# Patient Record
Sex: Female | Born: 1945 | Race: Black or African American | Hispanic: No | Marital: Single | Smoking: Never smoker
Health system: Southern US, Community
[De-identification: ages and names within clinical notes are randomized; demographics above are authoritative.]

## PROBLEM LIST (undated history)

## (undated) DIAGNOSIS — R921 Mammographic calcification found on diagnostic imaging of breast: Secondary | ICD-10-CM

## (undated) DIAGNOSIS — R87619 Unspecified abnormal cytological findings in specimens from cervix uteri: Secondary | ICD-10-CM

## (undated) DIAGNOSIS — R102 Pelvic and perineal pain: Secondary | ICD-10-CM

## (undated) DIAGNOSIS — N19 Unspecified kidney failure: Secondary | ICD-10-CM

## (undated) DIAGNOSIS — C801 Malignant (primary) neoplasm, unspecified: Secondary | ICD-10-CM

## (undated) DIAGNOSIS — IMO0002 Reserved for concepts with insufficient information to code with codable children: Secondary | ICD-10-CM

## (undated) DIAGNOSIS — K5732 Diverticulitis of large intestine without perforation or abscess without bleeding: Secondary | ICD-10-CM

## (undated) DIAGNOSIS — C880 Waldenstrom macroglobulinemia: Principal | ICD-10-CM

## (undated) HISTORY — PX: TONSILLECTOMY: SUR1361

## (undated) HISTORY — DX: Unspecified kidney failure: N19

## (undated) HISTORY — PX: TOTAL ABDOMINAL HYSTERECTOMY: SHX209

## (undated) HISTORY — DX: Diverticulitis of large intestine without perforation or abscess without bleeding: K57.32

## (undated) HISTORY — DX: Pelvic and perineal pain: R10.2

## (undated) HISTORY — DX: Mammographic calcification found on diagnostic imaging of breast: R92.1

## (undated) HISTORY — DX: Waldenstrom macroglobulinemia: C88.0

## (undated) HISTORY — PX: WISDOM TOOTH EXTRACTION: SHX21

## (undated) HISTORY — DX: Unspecified abnormal cytological findings in specimens from cervix uteri: R87.619

## (undated) HISTORY — DX: Reserved for concepts with insufficient information to code with codable children: IMO0002

---

## 2007-05-01 ENCOUNTER — Ambulatory Visit: Payer: Self-pay | Admitting: Hematology & Oncology

## 2007-05-18 LAB — CBC WITH DIFFERENTIAL/PLATELET
Basophils Absolute: 0 10*3/uL (ref 0.0–0.1)
Eosinophils Absolute: 0.1 10*3/uL (ref 0.0–0.5)
HGB: 12 g/dL (ref 11.6–15.9)
LYMPH%: 34.5 % (ref 14.0–48.0)
MCH: 29 pg (ref 26.0–34.0)
MCV: 82.4 fL (ref 81.0–101.0)
MONO%: 7.3 % (ref 0.0–13.0)
NEUT#: 3.6 10*3/uL (ref 1.5–6.5)
Platelets: 317 10*3/uL (ref 145–400)

## 2007-05-22 LAB — COMPREHENSIVE METABOLIC PANEL WITH GFR
ALT: 12 U/L (ref 0–35)
AST: 12 U/L (ref 0–37)
Albumin: 4 g/dL (ref 3.5–5.2)
Alkaline Phosphatase: 77 U/L (ref 39–117)
BUN: 13 mg/dL (ref 6–23)
CO2: 23 meq/L (ref 19–32)
Calcium: 10.3 mg/dL (ref 8.4–10.5)
Chloride: 103 meq/L (ref 96–112)
Creatinine, Ser: 0.96 mg/dL (ref 0.40–1.20)
Glucose, Bld: 112 mg/dL — ABNORMAL HIGH (ref 70–99)
Potassium: 4.2 meq/L (ref 3.5–5.3)
Sodium: 138 meq/L (ref 135–145)
Total Bilirubin: 0.4 mg/dL (ref 0.3–1.2)
Total Protein: 9 g/dL — ABNORMAL HIGH (ref 6.0–8.3)

## 2007-05-22 LAB — IGG, IGA, IGM
IgA: 109 mg/dL (ref 68–378)
IgG (Immunoglobin G), Serum: 1560 mg/dL (ref 694–1618)
IgM, Serum: 2490 mg/dL — ABNORMAL HIGH (ref 60–263)

## 2007-05-22 LAB — PROTEIN ELECTROPHORESIS, SERUM
Albumin ELP: 43.8 % — ABNORMAL LOW (ref 55.8–66.1)
Beta 2: 6.3 % (ref 3.2–6.5)
Beta Globulin: 5.2 % (ref 4.7–7.2)

## 2007-08-15 ENCOUNTER — Ambulatory Visit: Payer: Self-pay | Admitting: Hematology & Oncology

## 2007-08-17 LAB — CBC WITH DIFFERENTIAL/PLATELET
Basophils Absolute: 0.1 10*3/uL (ref 0.0–0.1)
Eosinophils Absolute: 0.1 10*3/uL (ref 0.0–0.5)
HGB: 11.9 g/dL (ref 11.6–15.9)
LYMPH%: 36.8 % (ref 14.0–48.0)
MCV: 82.3 fL (ref 81.0–101.0)
MONO%: 5.9 % (ref 0.0–13.0)
NEUT#: 4.1 10*3/uL (ref 1.5–6.5)
Platelets: 347 10*3/uL (ref 145–400)
RBC: 4.06 10*6/uL (ref 3.70–5.32)

## 2007-08-21 LAB — PROTEIN ELECTROPHORESIS, SERUM
Alpha-1-Globulin: 4.1 % (ref 2.9–4.9)
Alpha-2-Globulin: 8.1 % (ref 7.1–11.8)
M-Spike, %: 1.64 g/dL

## 2007-08-21 LAB — COMPREHENSIVE METABOLIC PANEL
Alkaline Phosphatase: 74 U/L (ref 39–117)
BUN: 17 mg/dL (ref 6–23)
Creatinine, Ser: 0.97 mg/dL (ref 0.40–1.20)
Glucose, Bld: 93 mg/dL (ref 70–99)
Total Bilirubin: 0.4 mg/dL (ref 0.3–1.2)

## 2007-08-21 LAB — IGG, IGA, IGM
IgA: 106 mg/dL (ref 68–378)
IgM, Serum: 2410 mg/dL — ABNORMAL HIGH (ref 60–263)

## 2007-09-13 DIAGNOSIS — R102 Pelvic and perineal pain: Secondary | ICD-10-CM

## 2007-09-13 HISTORY — DX: Pelvic and perineal pain: R10.2

## 2007-11-16 ENCOUNTER — Ambulatory Visit (HOSPITAL_COMMUNITY): Admission: RE | Admit: 2007-11-16 | Discharge: 2007-11-16 | Payer: Self-pay | Admitting: Gastroenterology

## 2007-11-21 ENCOUNTER — Ambulatory Visit: Payer: Self-pay | Admitting: Hematology & Oncology

## 2007-11-23 LAB — CBC & DIFF AND RETIC
Basophils Absolute: 0 10*3/uL (ref 0.0–0.1)
Eosinophils Absolute: 0.1 10*3/uL (ref 0.0–0.5)
HCT: 33.4 % — ABNORMAL LOW (ref 34.8–46.6)
HGB: 11.9 g/dL (ref 11.6–15.9)
IRF: 0.42 — ABNORMAL HIGH (ref 0.130–0.330)
LYMPH%: 34.8 % (ref 14.0–48.0)
MONO#: 0.6 10*3/uL (ref 0.1–0.9)
NEUT#: 4.5 10*3/uL (ref 1.5–6.5)
NEUT%: 55.4 % (ref 39.6–76.8)
Platelets: 376 10*3/uL (ref 145–400)
WBC: 8.1 10*3/uL (ref 3.9–10.0)

## 2007-11-27 LAB — PROTEIN ELECTROPHORESIS, SERUM
Alpha-2-Globulin: 8.9 % (ref 7.1–11.8)
Beta Globulin: 5.2 % (ref 4.7–7.2)
Gamma Globulin: 31.9 % — ABNORMAL HIGH (ref 11.1–18.8)
M-Spike, %: 1.47 g/dL

## 2007-11-27 LAB — COMPREHENSIVE METABOLIC PANEL
ALT: 9 U/L (ref 0–35)
Albumin: 3.5 g/dL (ref 3.5–5.2)
CO2: 23 mEq/L (ref 19–32)
Glucose, Bld: 105 mg/dL — ABNORMAL HIGH (ref 70–99)
Potassium: 4.2 mEq/L (ref 3.5–5.3)
Sodium: 139 mEq/L (ref 135–145)
Total Protein: 8.1 g/dL (ref 6.0–8.3)

## 2007-11-27 LAB — IGG, IGA, IGM: IgG (Immunoglobin G), Serum: 1430 mg/dL (ref 694–1618)

## 2007-11-28 ENCOUNTER — Encounter: Admission: RE | Admit: 2007-11-28 | Discharge: 2007-11-28 | Payer: Self-pay | Admitting: Internal Medicine

## 2008-02-19 ENCOUNTER — Ambulatory Visit: Payer: Self-pay | Admitting: Hematology & Oncology

## 2008-02-22 LAB — CBC WITH DIFFERENTIAL/PLATELET
Basophils Absolute: 0 10*3/uL (ref 0.0–0.1)
EOS%: 1.3 % (ref 0.0–7.0)
Eosinophils Absolute: 0.1 10*3/uL (ref 0.0–0.5)
HGB: 11.6 g/dL (ref 11.6–15.9)
NEUT#: 4.3 10*3/uL (ref 1.5–6.5)
RDW: 15.3 % — ABNORMAL HIGH (ref 11.3–14.5)
lymph#: 3.1 10*3/uL (ref 0.9–3.3)

## 2008-02-22 LAB — CHCC SMEAR

## 2008-02-26 LAB — COMPREHENSIVE METABOLIC PANEL
ALT: 10 U/L (ref 0–35)
Albumin: 3.7 g/dL (ref 3.5–5.2)
Alkaline Phosphatase: 71 U/L (ref 39–117)
CO2: 22 mEq/L (ref 19–32)
Glucose, Bld: 94 mg/dL (ref 70–99)
Potassium: 4.2 mEq/L (ref 3.5–5.3)
Sodium: 138 mEq/L (ref 135–145)
Total Protein: 8.4 g/dL — ABNORMAL HIGH (ref 6.0–8.3)

## 2008-02-26 LAB — PROTEIN ELECTROPHORESIS, SERUM
Albumin ELP: 42.7 % — ABNORMAL LOW (ref 55.8–66.1)
Alpha-2-Globulin: 9.2 % (ref 7.1–11.8)
Beta 2: 6.2 % (ref 3.2–6.5)
Beta Globulin: 5.1 % (ref 4.7–7.2)

## 2008-02-26 LAB — IGG, IGA, IGM: IgG (Immunoglobin G), Serum: 1650 mg/dL — ABNORMAL HIGH (ref 694–1618)

## 2008-05-22 ENCOUNTER — Ambulatory Visit: Payer: Self-pay | Admitting: Hematology & Oncology

## 2008-05-23 LAB — CBC WITH DIFFERENTIAL (CANCER CENTER ONLY)
BASO#: 0.1 10*3/uL (ref 0.0–0.2)
EOS%: 2.5 % (ref 0.0–7.0)
HCT: 29.8 % — ABNORMAL LOW (ref 34.8–46.6)
MCV: 80 fL — ABNORMAL LOW (ref 81–101)
MONO%: 5.9 % (ref 0.0–13.0)
NEUT#: 4.5 10*3/uL (ref 1.5–6.5)
NEUT%: 48.4 % (ref 39.6–80.0)
RBC: 3.72 10*6/uL (ref 3.70–5.32)
RDW: 14.9 % — ABNORMAL HIGH (ref 10.5–14.6)
WBC: 9.4 10*3/uL (ref 3.9–10.0)

## 2008-05-23 LAB — CHCC SATELLITE - SMEAR

## 2008-05-26 LAB — TRANSFERRIN RECEPTOR, SOLUABLE: Transferrin Receptor, Soluble: 22.7 nmol/L

## 2008-05-26 LAB — FERRITIN: Ferritin: 37 ng/mL (ref 10–291)

## 2008-05-28 LAB — PROTEIN ELECTROPHORESIS, SERUM
Alpha-2-Globulin: 9.9 % (ref 7.1–11.8)
Beta 2: 5.5 % (ref 3.2–6.5)
Beta Globulin: 5.3 % (ref 4.7–7.2)
M-Spike, %: 1.64 g/dL
Total Protein, Serum Electrophoresis: 8.3 g/dL (ref 6.0–8.3)

## 2008-05-28 LAB — COMPREHENSIVE METABOLIC PANEL
AST: 15 U/L (ref 0–37)
Albumin: 3.7 g/dL (ref 3.5–5.2)
Alkaline Phosphatase: 72 U/L (ref 39–117)
Glucose, Bld: 88 mg/dL (ref 70–99)
Potassium: 4.2 mEq/L (ref 3.5–5.3)
Sodium: 141 mEq/L (ref 135–145)
Total Bilirubin: 0.5 mg/dL (ref 0.3–1.2)
Total Protein: 8.3 g/dL (ref 6.0–8.3)

## 2008-05-28 LAB — IGG, IGA, IGM: IgG (Immunoglobin G), Serum: 1360 mg/dL (ref 694–1618)

## 2008-05-28 LAB — LACTATE DEHYDROGENASE: LDH: 124 U/L (ref 94–250)

## 2008-06-19 LAB — RETICULOCYTES (CHCC)
ABS Retic: 69.7 10*3/uL (ref 19.0–186.0)
RBC.: 4.1 MIL/uL (ref 3.87–5.11)
Retic Ct Pct: 1.7 % (ref 0.4–3.1)

## 2008-06-19 LAB — CBC WITH DIFFERENTIAL (CANCER CENTER ONLY)
BASO%: 0.7 % (ref 0.0–2.0)
Eosinophils Absolute: 0.2 10*3/uL (ref 0.0–0.5)
MONO#: 0.7 10*3/uL (ref 0.1–0.9)
MONO%: 7.8 % (ref 0.0–13.0)
NEUT#: 4 10*3/uL (ref 1.5–6.5)
Platelets: 400 10*3/uL (ref 145–400)
RBC: 4.16 10*6/uL (ref 3.70–5.32)
RDW: 14.9 % — ABNORMAL HIGH (ref 10.5–14.6)
WBC: 8.6 10*3/uL (ref 3.9–10.0)

## 2008-08-20 ENCOUNTER — Ambulatory Visit: Payer: Self-pay | Admitting: Hematology & Oncology

## 2008-08-21 LAB — CBC WITH DIFFERENTIAL (CANCER CENTER ONLY)
BASO%: 0.6 % (ref 0.0–2.0)
EOS%: 2.1 % (ref 0.0–7.0)
LYMPH#: 3.8 10*3/uL — ABNORMAL HIGH (ref 0.9–3.3)
MCHC: 33.5 g/dL (ref 32.0–36.0)
NEUT#: 3.9 10*3/uL (ref 1.5–6.5)
NEUT%: 45.8 % (ref 39.6–80.0)
RDW: 14.8 % — ABNORMAL HIGH (ref 10.5–14.6)

## 2008-08-25 LAB — SPEP & IFE WITH QIG
Albumin ELP: 40.8 % — ABNORMAL LOW (ref 55.8–66.1)
Alpha-1-Globulin: 4.3 % (ref 2.9–4.9)
Alpha-2-Globulin: 9.7 % (ref 7.1–11.8)
Beta 2: 6.6 % — ABNORMAL HIGH (ref 3.2–6.5)
Beta Globulin: 5.1 % (ref 4.7–7.2)
IgA: 117 mg/dL (ref 68–378)
Total Protein, Serum Electrophoresis: 7.9 g/dL (ref 6.0–8.3)

## 2008-08-25 LAB — RETICULOCYTES (CHCC)
RBC.: 4.12 MIL/uL (ref 3.87–5.11)
Retic Ct Pct: 1.7 % (ref 0.4–3.1)

## 2008-09-12 DIAGNOSIS — R921 Mammographic calcification found on diagnostic imaging of breast: Secondary | ICD-10-CM

## 2008-09-12 HISTORY — DX: Mammographic calcification found on diagnostic imaging of breast: R92.1

## 2008-11-26 ENCOUNTER — Ambulatory Visit: Payer: Self-pay | Admitting: Hematology & Oncology

## 2008-11-27 LAB — CBC WITH DIFFERENTIAL (CANCER CENTER ONLY)
BASO%: 0.6 % (ref 0.0–2.0)
EOS%: 2.1 % (ref 0.0–7.0)
HCT: 34.4 % — ABNORMAL LOW (ref 34.8–46.6)
LYMPH%: 39.4 % (ref 14.0–48.0)
MCHC: 32.8 g/dL (ref 32.0–36.0)
MCV: 84 fL (ref 81–101)
NEUT%: 50.7 % (ref 39.6–80.0)
RDW: 14.8 % — ABNORMAL HIGH (ref 10.5–14.6)

## 2008-11-27 LAB — CHCC SATELLITE - SMEAR

## 2008-11-28 ENCOUNTER — Encounter: Admission: RE | Admit: 2008-11-28 | Discharge: 2008-11-28 | Payer: Self-pay | Admitting: Internal Medicine

## 2008-12-01 LAB — COMPREHENSIVE METABOLIC PANEL
BUN: 18 mg/dL (ref 6–23)
CO2: 24 mEq/L (ref 19–32)
Creatinine, Ser: 0.98 mg/dL (ref 0.40–1.20)
Glucose, Bld: 94 mg/dL (ref 70–99)
Total Bilirubin: 0.3 mg/dL (ref 0.3–1.2)
Total Protein: 7.4 g/dL (ref 6.0–8.3)

## 2008-12-01 LAB — SPEP & IFE WITH QIG
Alpha-2-Globulin: 10.1 % (ref 7.1–11.8)
Beta 2: 6.9 % — ABNORMAL HIGH (ref 3.2–6.5)
Beta Globulin: 4.9 % (ref 4.7–7.2)
IgG (Immunoglobin G), Serum: 1120 mg/dL (ref 694–1618)
M-Spike, %: 1.7 g/dL
Total Protein, Serum Electrophoresis: 7.4 g/dL (ref 6.0–8.3)

## 2008-12-01 LAB — KAPPA/LAMBDA LIGHT CHAINS
Kappa free light chain: 17.5 mg/dL — ABNORMAL HIGH (ref 0.33–1.94)
Kappa:Lambda Ratio: 0.15 — ABNORMAL LOW (ref 0.26–1.65)
Lambda Free Lght Chn: 114 mg/dL — ABNORMAL HIGH (ref 0.57–2.63)

## 2008-12-03 ENCOUNTER — Encounter: Admission: RE | Admit: 2008-12-03 | Discharge: 2008-12-03 | Payer: Self-pay | Admitting: Internal Medicine

## 2009-02-11 ENCOUNTER — Ambulatory Visit: Payer: Self-pay | Admitting: Hematology & Oncology

## 2009-02-12 LAB — CBC WITH DIFFERENTIAL (CANCER CENTER ONLY)
Eosinophils Absolute: 0.3 10*3/uL (ref 0.0–0.5)
HCT: 33 % — ABNORMAL LOW (ref 34.8–46.6)
LYMPH%: 36.2 % (ref 14.0–48.0)
MCV: 81 fL (ref 81–101)
MONO#: 0.8 10*3/uL (ref 0.1–0.9)
NEUT%: 52.6 % (ref 39.6–80.0)
RBC: 4.06 10*6/uL (ref 3.70–5.32)
RDW: 15.4 % — ABNORMAL HIGH (ref 10.5–14.6)
WBC: 10 10*3/uL (ref 3.9–10.0)

## 2009-02-16 LAB — KAPPA/LAMBDA LIGHT CHAINS
Kappa free light chain: 12.3 mg/dL — ABNORMAL HIGH (ref 0.33–1.94)
Lambda Free Lght Chn: 108 mg/dL — ABNORMAL HIGH (ref 0.57–2.63)

## 2009-02-16 LAB — SPEP & IFE WITH QIG
Albumin ELP: 38.8 % — ABNORMAL LOW (ref 55.8–66.1)
IgA: 102 mg/dL (ref 68–378)
IgG (Immunoglobin G), Serum: 1150 mg/dL (ref 694–1618)
M-Spike, %: 1.58 g/dL
Total Protein, Serum Electrophoresis: 7.4 g/dL (ref 6.0–8.3)

## 2009-02-16 LAB — COMPREHENSIVE METABOLIC PANEL
Albumin: 2.9 g/dL — ABNORMAL LOW (ref 3.5–5.2)
BUN: 13 mg/dL (ref 6–23)
CO2: 23 mEq/L (ref 19–32)
Calcium: 9.7 mg/dL (ref 8.4–10.5)
Chloride: 106 mEq/L (ref 96–112)
Glucose, Bld: 89 mg/dL (ref 70–99)
Potassium: 4.2 mEq/L (ref 3.5–5.3)
Sodium: 140 mEq/L (ref 135–145)
Total Protein: 7.4 g/dL (ref 6.0–8.3)

## 2009-02-16 LAB — FERRITIN: Ferritin: 56 ng/mL (ref 10–291)

## 2009-05-19 ENCOUNTER — Ambulatory Visit: Payer: Self-pay | Admitting: Hematology & Oncology

## 2009-05-20 LAB — CBC WITH DIFFERENTIAL (CANCER CENTER ONLY)
BASO#: 0.1 10*3/uL (ref 0.0–0.2)
Eosinophils Absolute: 0.2 10*3/uL (ref 0.0–0.5)
HCT: 32.1 % — ABNORMAL LOW (ref 34.8–46.6)
HGB: 11 g/dL — ABNORMAL LOW (ref 11.6–15.9)
LYMPH%: 34 % (ref 14.0–48.0)
MCV: 81 fL (ref 81–101)
MONO#: 0.5 10*3/uL (ref 0.1–0.9)
Platelets: 434 10*3/uL — ABNORMAL HIGH (ref 145–400)
RBC: 3.96 10*6/uL (ref 3.70–5.32)
WBC: 9.9 10*3/uL (ref 3.9–10.0)

## 2009-05-20 LAB — CHCC SATELLITE - SMEAR

## 2009-05-21 LAB — LIPID PANEL
Cholesterol: 193 mg/dL (ref 0–200)
HDL: 40 mg/dL (ref >39)
Total CHOL/HDL Ratio: 4.8 Ratio
Triglycerides: 153 mg/dL — ABNORMAL HIGH (ref <150)

## 2009-05-22 LAB — COMPREHENSIVE METABOLIC PANEL
AST: 13 U/L (ref 0–37)
Albumin: 2.7 g/dL — ABNORMAL LOW (ref 3.5–5.2)
BUN: 18 mg/dL (ref 6–23)
Calcium: 9.9 mg/dL (ref 8.4–10.5)
Chloride: 109 mEq/L (ref 96–112)
Glucose, Bld: 99 mg/dL (ref 70–99)
Potassium: 4.2 mEq/L (ref 3.5–5.3)
Sodium: 140 mEq/L (ref 135–145)
Total Protein: 7.2 g/dL (ref 6.0–8.3)

## 2009-05-22 LAB — SPEP & IFE WITH QIG
Albumin ELP: 37.7 % — ABNORMAL LOW (ref 55.8–66.1)
Alpha-1-Globulin: 4.7 % (ref 2.9–4.9)
Beta 2: 7.2 % — ABNORMAL HIGH (ref 3.2–6.5)
IgM, Serum: 2340 mg/dL — ABNORMAL HIGH (ref 60–263)
Total Protein, Serum Electrophoresis: 7.2 g/dL (ref 6.0–8.3)

## 2009-05-22 LAB — KAPPA/LAMBDA LIGHT CHAINS: Lambda Free Lght Chn: 128 mg/dL — ABNORMAL HIGH (ref 0.57–2.63)

## 2009-05-22 LAB — RETICULOCYTES (CHCC)
ABS Retic: 69.5 10*3/uL (ref 19.0–186.0)
Retic Ct Pct: 1.8 % (ref 0.4–3.1)

## 2009-08-18 ENCOUNTER — Ambulatory Visit: Payer: Self-pay | Admitting: Hematology & Oncology

## 2009-08-19 LAB — CBC WITH DIFFERENTIAL (CANCER CENTER ONLY)
Eosinophils Absolute: 0.3 10*3/uL (ref 0.0–0.5)
LYMPH#: 4.7 10*3/uL — ABNORMAL HIGH (ref 0.9–3.3)
MONO#: 0.8 10*3/uL (ref 0.1–0.9)
MONO%: 7.4 % (ref 0.0–13.0)
NEUT#: 5.3 10*3/uL (ref 1.5–6.5)
Platelets: 436 10*3/uL — ABNORMAL HIGH (ref 145–400)
RBC: 4.09 10*6/uL (ref 3.70–5.32)
WBC: 11.2 10*3/uL — ABNORMAL HIGH (ref 3.9–10.0)

## 2009-08-22 LAB — COMPREHENSIVE METABOLIC PANEL
ALT: 10 U/L (ref 0–35)
AST: 14 U/L (ref 0–37)
Albumin: 2.8 g/dL — ABNORMAL LOW (ref 3.5–5.2)
Alkaline Phosphatase: 68 U/L (ref 39–117)
Potassium: 4.1 mEq/L (ref 3.5–5.3)
Sodium: 141 mEq/L (ref 135–145)
Total Protein: 7.5 g/dL (ref 6.0–8.3)

## 2009-08-22 LAB — SPEP & IFE WITH QIG
Albumin ELP: 34.5 % — ABNORMAL LOW (ref 55.8–66.1)
Alpha-2-Globulin: 11.7 % (ref 7.1–11.8)
Beta Globulin: 5.4 % (ref 4.7–7.2)
Total Protein, Serum Electrophoresis: 7.5 g/dL (ref 6.0–8.3)

## 2009-11-10 ENCOUNTER — Ambulatory Visit: Payer: Self-pay | Admitting: Hematology & Oncology

## 2009-11-11 LAB — CBC WITH DIFFERENTIAL (CANCER CENTER ONLY)
BASO#: 0.1 10*3/uL (ref 0.0–0.2)
Eosinophils Absolute: 0.2 10*3/uL (ref 0.0–0.5)
HCT: 33.4 % — ABNORMAL LOW (ref 34.8–46.6)
HGB: 11.1 g/dL — ABNORMAL LOW (ref 11.6–15.9)
LYMPH%: 41.2 % (ref 14.0–48.0)
MCH: 26.9 pg (ref 26.0–34.0)
MCV: 81 fL (ref 81–101)
MONO#: 0.8 10*3/uL (ref 0.1–0.9)
MONO%: 6.8 % (ref 0.0–13.0)
Platelets: 561 10*3/uL — ABNORMAL HIGH (ref 145–400)
RBC: 4.1 10*6/uL (ref 3.70–5.32)
WBC: 11.5 10*3/uL — ABNORMAL HIGH (ref 3.9–10.0)

## 2009-11-11 LAB — TECHNOLOGIST REVIEW CHCC SATELLITE: Tech Review: 2

## 2009-11-15 LAB — COMPREHENSIVE METABOLIC PANEL
ALT: 12 U/L (ref 0–35)
CO2: 25 mEq/L (ref 19–32)
Calcium: 9.4 mg/dL (ref 8.4–10.5)
Chloride: 105 mEq/L (ref 96–112)
Creatinine, Ser: 1.38 mg/dL — ABNORMAL HIGH (ref 0.40–1.20)
Glucose, Bld: 79 mg/dL (ref 70–99)
Sodium: 140 mEq/L (ref 135–145)
Total Protein: 7.4 g/dL (ref 6.0–8.3)

## 2009-11-15 LAB — SPEP & IFE WITH QIG
Alpha-1-Globulin: 5.2 % — ABNORMAL HIGH (ref 2.9–4.9)
Alpha-2-Globulin: 12.9 % — ABNORMAL HIGH (ref 7.1–11.8)
Beta 2: 8.2 % — ABNORMAL HIGH (ref 3.2–6.5)
Gamma Globulin: 35.1 % — ABNORMAL HIGH (ref 11.1–18.8)
IgG (Immunoglobin G), Serum: 1100 mg/dL (ref 694–1618)
M-Spike, %: 1.84 g/dL

## 2009-11-15 LAB — VISCOSITY, SERUM: Viscosity, Serum: 2.5 rel to H2O — ABNORMAL HIGH (ref 1.5–1.9)

## 2009-12-01 ENCOUNTER — Ambulatory Visit (HOSPITAL_COMMUNITY): Admission: RE | Admit: 2009-12-01 | Discharge: 2009-12-01 | Payer: Self-pay | Admitting: Hematology & Oncology

## 2009-12-04 ENCOUNTER — Encounter: Admission: RE | Admit: 2009-12-04 | Discharge: 2009-12-04 | Payer: Self-pay | Admitting: Obstetrics and Gynecology

## 2009-12-10 ENCOUNTER — Ambulatory Visit: Payer: Self-pay | Admitting: Hematology & Oncology

## 2010-01-12 ENCOUNTER — Ambulatory Visit: Payer: Self-pay | Admitting: Hematology & Oncology

## 2010-01-13 LAB — COMPREHENSIVE METABOLIC PANEL
AST: 19 U/L (ref 0–37)
Albumin: 2.6 g/dL — ABNORMAL LOW (ref 3.5–5.2)
Alkaline Phosphatase: 64 U/L (ref 39–117)
BUN: 13 mg/dL (ref 6–23)
Potassium: 4.3 mEq/L (ref 3.5–5.3)
Sodium: 142 mEq/L (ref 135–145)
Total Bilirubin: 0.3 mg/dL (ref 0.3–1.2)
Total Protein: 6.6 g/dL (ref 6.0–8.3)

## 2010-01-13 LAB — CBC WITH DIFFERENTIAL (CANCER CENTER ONLY)
EOS%: 4.5 % (ref 0.0–7.0)
LYMPH%: 41.4 % (ref 14.0–48.0)
MCH: 27.9 pg (ref 26.0–34.0)
MCHC: 33.2 g/dL (ref 32.0–36.0)
MCV: 84 fL (ref 81–101)
MONO%: 9.1 % (ref 0.0–13.0)
NEUT#: 3 10*3/uL (ref 1.5–6.5)
Platelets: 560 10*3/uL — ABNORMAL HIGH (ref 145–400)

## 2010-01-14 LAB — HEPATITIS PANEL, ACUTE
HCV Ab: NEGATIVE
Hep B C IgM: NEGATIVE

## 2010-02-01 LAB — CBC WITH DIFFERENTIAL (CANCER CENTER ONLY)
BASO%: 1 % (ref 0.0–2.0)
Eosinophils Absolute: 0.4 10*3/uL (ref 0.0–0.5)
LYMPH#: 2.9 10*3/uL (ref 0.9–3.3)
LYMPH%: 40 % (ref 14.0–48.0)
MCV: 85 fL (ref 81–101)
MONO#: 0.6 10*3/uL (ref 0.1–0.9)
Platelets: 529 10*3/uL — ABNORMAL HIGH (ref 145–400)
RBC: 4.23 10*6/uL (ref 3.70–5.32)
RDW: 16.2 % — ABNORMAL HIGH (ref 10.5–14.6)
WBC: 7.2 10*3/uL (ref 3.9–10.0)

## 2010-02-26 ENCOUNTER — Ambulatory Visit: Payer: Self-pay | Admitting: Hematology & Oncology

## 2010-03-01 LAB — CBC WITH DIFFERENTIAL (CANCER CENTER ONLY)
BASO%: 0.8 % (ref 0.0–2.0)
EOS%: 4 % (ref 0.0–7.0)
HCT: 35.8 % (ref 34.8–46.6)
HGB: 12.1 g/dL (ref 11.6–15.9)
LYMPH%: 36.3 % (ref 14.0–48.0)
MCV: 85 fL (ref 81–101)
NEUT%: 50.9 % (ref 39.6–80.0)
RBC: 4.2 10*6/uL (ref 3.70–5.32)

## 2010-03-08 LAB — JAK2 GENOTYPR: JAK2 GenotypR: NOT DETECTED

## 2010-03-08 LAB — COMPREHENSIVE METABOLIC PANEL
ALT: 10 U/L (ref 0–35)
AST: 16 U/L (ref 0–37)
Alkaline Phosphatase: 62 U/L (ref 39–117)
Calcium: 10.4 mg/dL (ref 8.4–10.5)
Chloride: 107 mEq/L (ref 96–112)
Creatinine, Ser: 1.45 mg/dL — ABNORMAL HIGH (ref 0.40–1.20)

## 2010-03-08 LAB — SPEP & IFE WITH QIG
Beta 2: 7.1 % — ABNORMAL HIGH (ref 3.2–6.5)
Beta Globulin: 5.2 % (ref 4.7–7.2)
Gamma Globulin: 27.7 % — ABNORMAL HIGH (ref 11.1–18.8)
IgA: 64 mg/dL — ABNORMAL LOW (ref 68–378)
IgG (Immunoglobin G), Serum: 584 mg/dL — ABNORMAL LOW (ref 694–1618)
M-Spike, %: 1.28 g/dL
Total Protein, Serum Electrophoresis: 6.2 g/dL (ref 6.0–8.3)

## 2010-03-08 LAB — KAPPA/LAMBDA LIGHT CHAINS: Kappa:Lambda Ratio: 0.17 — ABNORMAL LOW (ref 0.26–1.65)

## 2010-03-08 LAB — FERRITIN: Ferritin: 139 ng/mL (ref 10–291)

## 2010-03-22 LAB — CBC WITH DIFFERENTIAL (CANCER CENTER ONLY)
BASO%: 0.9 % (ref 0.0–2.0)
Eosinophils Absolute: 0.3 10*3/uL (ref 0.0–0.5)
HGB: 11.4 g/dL — ABNORMAL LOW (ref 11.6–15.9)
LYMPH%: 40.2 % (ref 14.0–48.0)
MCH: 28.7 pg (ref 26.0–34.0)
MONO%: 8.6 % (ref 0.0–13.0)
NEUT%: 45.5 % (ref 39.6–80.0)
RDW: 15 % — ABNORMAL HIGH (ref 10.5–14.6)

## 2010-04-13 ENCOUNTER — Ambulatory Visit: Payer: Self-pay | Admitting: Hematology & Oncology

## 2010-04-14 LAB — CBC WITH DIFFERENTIAL (CANCER CENTER ONLY)
BASO#: 0 10*3/uL (ref 0.0–0.2)
BASO%: 0.6 % (ref 0.0–2.0)
EOS%: 3.9 % (ref 0.0–7.0)
Eosinophils Absolute: 0.3 10*3/uL (ref 0.0–0.5)
HGB: 11.2 g/dL — ABNORMAL LOW (ref 11.6–15.9)
LYMPH%: 30.8 % (ref 14.0–48.0)
MCHC: 34.1 g/dL (ref 32.0–36.0)
MONO%: 6.8 % (ref 0.0–13.0)
NEUT%: 57.9 % (ref 39.6–80.0)
Platelets: 437 10*3/uL — ABNORMAL HIGH (ref 145–400)
RBC: 3.82 10*6/uL (ref 3.70–5.32)
RDW: 15.5 % — ABNORMAL HIGH (ref 10.5–14.6)

## 2010-04-16 LAB — PROTEIN ELECTROPHORESIS, SERUM
Albumin ELP: 39.1 % — ABNORMAL LOW (ref 55.8–66.1)
Alpha-2-Globulin: 15.5 % — ABNORMAL HIGH (ref 7.1–11.8)
Beta 2: 6 % (ref 3.2–6.5)
Beta Globulin: 5.1 % (ref 4.7–7.2)
Gamma Globulin: 24.2 % — ABNORMAL HIGH (ref 11.1–18.8)
M-Spike, %: 1.03 g/dL

## 2010-04-16 LAB — COMPREHENSIVE METABOLIC PANEL
Albumin: 2.7 g/dL — ABNORMAL LOW (ref 3.5–5.2)
BUN: 16 mg/dL (ref 6–23)
Glucose, Bld: 88 mg/dL (ref 70–99)
Sodium: 141 mEq/L (ref 135–145)

## 2010-04-16 LAB — FERRITIN: Ferritin: 148 ng/mL (ref 10–291)

## 2010-04-16 LAB — KAPPA/LAMBDA LIGHT CHAINS: Kappa free light chain: 14.2 mg/dL — ABNORMAL HIGH (ref 0.33–1.94)

## 2010-04-16 LAB — BETA 2 MICROGLOBULIN, SERUM: Beta-2 Microglobulin: 6.16 mg/L — ABNORMAL HIGH (ref 1.01–1.73)

## 2010-05-20 ENCOUNTER — Ambulatory Visit (HOSPITAL_COMMUNITY): Admission: RE | Admit: 2010-05-20 | Discharge: 2010-05-20 | Payer: Self-pay | Admitting: Hematology & Oncology

## 2010-06-01 ENCOUNTER — Ambulatory Visit: Payer: Self-pay | Admitting: Hematology & Oncology

## 2010-06-02 LAB — CBC WITH DIFFERENTIAL (CANCER CENTER ONLY)
EOS%: 2.9 % (ref 0.0–7.0)
Eosinophils Absolute: 0.2 10*3/uL (ref 0.0–0.5)
HCT: 36.1 % (ref 34.8–46.6)
HGB: 11.9 g/dL (ref 11.6–15.9)
LYMPH#: 2.5 10*3/uL (ref 0.9–3.3)
LYMPH%: 34.2 % (ref 14.0–48.0)
MCHC: 33.1 g/dL (ref 32.0–36.0)
MCV: 86 fL (ref 81–101)
NEUT#: 4.1 10*3/uL (ref 1.5–6.5)
Platelets: 439 10*3/uL — ABNORMAL HIGH (ref 145–400)

## 2010-06-04 LAB — COMPREHENSIVE METABOLIC PANEL
ALT: 9 U/L (ref 0–35)
AST: 18 U/L (ref 0–37)
Alkaline Phosphatase: 63 U/L (ref 39–117)
Glucose, Bld: 85 mg/dL (ref 70–99)
Potassium: 4.5 mEq/L (ref 3.5–5.3)
Total Bilirubin: 0.4 mg/dL (ref 0.3–1.2)
Total Protein: 6.1 g/dL (ref 6.0–8.3)

## 2010-06-04 LAB — SPEP & IFE WITH QIG
Alpha-1-Globulin: 9.4 % — ABNORMAL HIGH (ref 2.9–4.9)
Alpha-2-Globulin: 15.2 % — ABNORMAL HIGH (ref 7.1–11.8)
Beta Globulin: 5 % (ref 4.7–7.2)
Total Protein, Serum Electrophoresis: 6.2 g/dL (ref 6.0–8.3)

## 2010-06-04 LAB — KAPPA/LAMBDA LIGHT CHAINS
Kappa free light chain: 14.3 mg/dL — ABNORMAL HIGH (ref 0.33–1.94)
Lambda Free Lght Chn: 118 mg/dL — ABNORMAL HIGH (ref 0.57–2.63)

## 2010-08-24 ENCOUNTER — Ambulatory Visit: Payer: Self-pay | Admitting: Hematology & Oncology

## 2010-08-25 LAB — CBC WITH DIFFERENTIAL (CANCER CENTER ONLY)
BASO%: 0.4 % (ref 0.0–2.0)
EOS%: 3 % (ref 0.0–7.0)
HCT: 33.9 % — ABNORMAL LOW (ref 34.8–46.6)
LYMPH%: 37.4 % (ref 14.0–48.0)
MCH: 28 pg (ref 26.0–34.0)
MCHC: 33.3 g/dL (ref 32.0–36.0)
MCV: 84 fL (ref 81–101)
MONO%: 7.5 % (ref 0.0–13.0)
NEUT%: 51.7 % (ref 39.6–80.0)
RDW: 14.2 % (ref 10.5–14.6)

## 2010-08-27 LAB — COMPREHENSIVE METABOLIC PANEL
ALT: 12 U/L (ref 0–35)
AST: 16 U/L (ref 0–37)
Alkaline Phosphatase: 58 U/L (ref 39–117)
CO2: 22 mEq/L (ref 19–32)
Creatinine, Ser: 1.88 mg/dL — ABNORMAL HIGH (ref 0.40–1.20)
Total Bilirubin: 0.3 mg/dL (ref 0.3–1.2)

## 2010-08-27 LAB — FERRITIN: Ferritin: 84 ng/mL (ref 10–291)

## 2010-08-27 LAB — SPEP & IFE WITH QIG
Alpha-1-Globulin: 5.5 % — ABNORMAL HIGH (ref 2.9–4.9)
Beta 2: 7.2 % — ABNORMAL HIGH (ref 3.2–6.5)
Gamma Globulin: 26 % — ABNORMAL HIGH (ref 11.1–18.8)
IgM, Serum: 2140 mg/dL — ABNORMAL HIGH (ref 60–263)

## 2010-08-27 LAB — LACTATE DEHYDROGENASE: LDH: 136 U/L (ref 94–250)

## 2010-08-27 LAB — KAPPA/LAMBDA LIGHT CHAINS: Kappa:Lambda Ratio: 0.27 (ref 0.26–1.65)

## 2010-10-03 ENCOUNTER — Encounter: Payer: Self-pay | Admitting: Internal Medicine

## 2010-10-21 ENCOUNTER — Other Ambulatory Visit: Payer: Self-pay | Admitting: Hematology & Oncology

## 2010-10-21 ENCOUNTER — Encounter (HOSPITAL_BASED_OUTPATIENT_CLINIC_OR_DEPARTMENT_OTHER): Payer: Managed Care, Other (non HMO) | Admitting: Hematology & Oncology

## 2010-10-21 DIAGNOSIS — Z5112 Encounter for antineoplastic immunotherapy: Secondary | ICD-10-CM

## 2010-10-21 DIAGNOSIS — C8589 Other specified types of non-Hodgkin lymphoma, extranodal and solid organ sites: Secondary | ICD-10-CM

## 2010-10-21 LAB — CBC WITH DIFFERENTIAL (CANCER CENTER ONLY)
BASO#: 0.1 10*3/uL (ref 0.0–0.2)
BASO%: 0.7 % (ref 0.0–2.0)
Eosinophils Absolute: 0.2 10*3/uL (ref 0.0–0.5)
LYMPH#: 3.1 10*3/uL (ref 0.9–3.3)
LYMPH%: 38.1 % (ref 14.0–48.0)
MCH: 28.8 pg (ref 26.0–34.0)
MONO#: 0.5 10*3/uL (ref 0.1–0.9)
NEUT#: 4.2 10*3/uL (ref 1.5–6.5)
NEUT%: 52.4 % (ref 39.6–80.0)

## 2010-10-21 LAB — CMP (CANCER CENTER ONLY)
Chloride: 106 mEq/L (ref 98–108)
Creat: 1.8 mg/dl — ABNORMAL HIGH (ref 0.6–1.2)
Glucose, Bld: 96 mg/dL (ref 73–118)
Potassium: 4.1 mEq/L (ref 3.3–4.7)
Sodium: 136 mEq/L (ref 128–145)

## 2010-10-25 LAB — FERRITIN: Ferritin: 466 ng/mL — ABNORMAL HIGH (ref 10–291)

## 2010-10-25 LAB — SPEP & IFE WITH QIG
Albumin ELP: 41.1 % — ABNORMAL LOW (ref 55.8–66.1)
Alpha-1-Globulin: 5.4 % — ABNORMAL HIGH (ref 2.9–4.9)
Beta 2: 7.2 % — ABNORMAL HIGH (ref 3.2–6.5)
IgG (Immunoglobin G), Serum: 638 mg/dL — ABNORMAL LOW (ref 694–1618)
IgM, Serum: 1880 mg/dL — ABNORMAL HIGH (ref 60–263)
M-Spike, %: 1.08 g/dL
Total Protein, Serum Electrophoresis: 5.8 g/dL — ABNORMAL LOW (ref 6.0–8.3)

## 2010-10-25 LAB — KAPPA/LAMBDA LIGHT CHAINS: Lambda Free Lght Chn: 86.7 mg/dL — ABNORMAL HIGH (ref 0.57–2.63)

## 2010-11-02 ENCOUNTER — Other Ambulatory Visit: Payer: Self-pay | Admitting: Obstetrics and Gynecology

## 2010-11-02 DIAGNOSIS — Z1231 Encounter for screening mammogram for malignant neoplasm of breast: Secondary | ICD-10-CM

## 2010-11-25 LAB — BONE MARROW EXAM

## 2010-11-25 LAB — CHROMOSOME ANALYSIS, BONE MARROW

## 2010-11-25 LAB — APTT: aPTT: 44 seconds — ABNORMAL HIGH (ref 24–37)

## 2010-11-25 LAB — CBC
HCT: 33.1 % — ABNORMAL LOW (ref 36.0–46.0)
Hemoglobin: 11.6 g/dL — ABNORMAL LOW (ref 12.0–15.0)
MCV: 85.6 fL (ref 78.0–100.0)
RBC: 3.87 MIL/uL (ref 3.87–5.11)
RDW: 18.1 % — ABNORMAL HIGH (ref 11.5–15.5)
WBC: 8.4 10*3/uL (ref 4.0–10.5)

## 2010-11-25 LAB — PROTIME-INR: INR: 0.94 (ref 0.00–1.49)

## 2010-12-06 ENCOUNTER — Ambulatory Visit
Admission: RE | Admit: 2010-12-06 | Discharge: 2010-12-06 | Disposition: A | Discharge status: Home / Self Care | Payer: Managed Care, Other (non HMO) | Source: Ambulatory Visit | Attending: Obstetrics and Gynecology | Admitting: Obstetrics and Gynecology

## 2010-12-06 DIAGNOSIS — Z1231 Encounter for screening mammogram for malignant neoplasm of breast: Secondary | ICD-10-CM

## 2010-12-06 LAB — PROTIME-INR
INR: 1.01 (ref 0.00–1.49)
Prothrombin Time: 13.2 seconds (ref 11.6–15.2)

## 2010-12-06 LAB — CBC
Platelets: 659 10*3/uL — ABNORMAL HIGH (ref 150–400)
RBC: 3.76 MIL/uL — ABNORMAL LOW (ref 3.87–5.11)
WBC: 11.5 10*3/uL — ABNORMAL HIGH (ref 4.0–10.5)

## 2010-12-06 LAB — CHROMOSOME ANALYSIS, BONE MARROW

## 2010-12-06 LAB — BONE MARROW EXAM

## 2010-12-13 IMAGING — CT CT BIOPSY
1 of 2 series · 14 of 32 positions shown, 19 images · non-contrast
Comparison: none

CLINICAL DATA: Lymphoma

[Series 2: rtn pelvis st · axial · 0.90mm/px · z∈[-146,-62]mm · 14 of 21 slices shown, 19 images]
[im 2/21  soft-tissue]
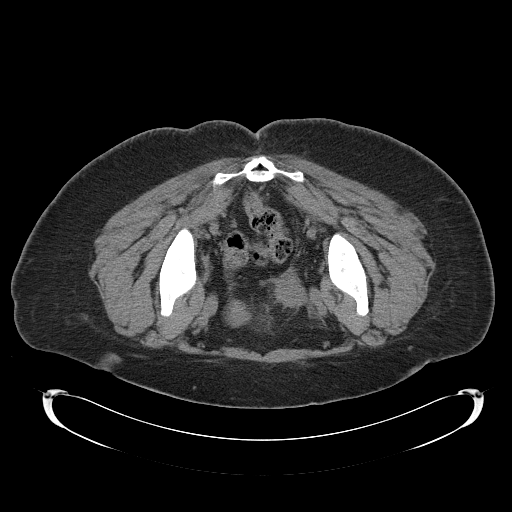
[im 2/21  bone]
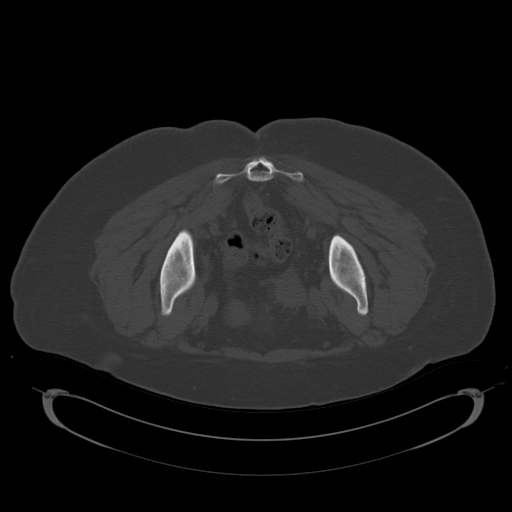
[im 3/21  soft-tissue]
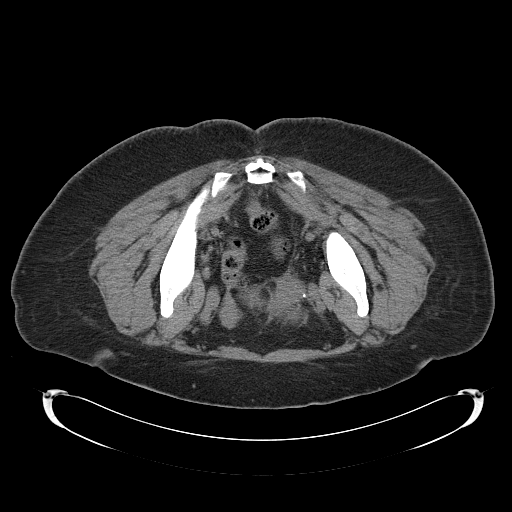
[im 5/21  soft-tissue]
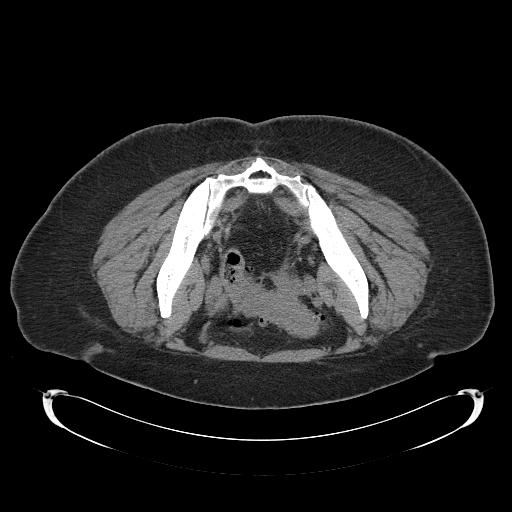
[im 6/21  soft-tissue]
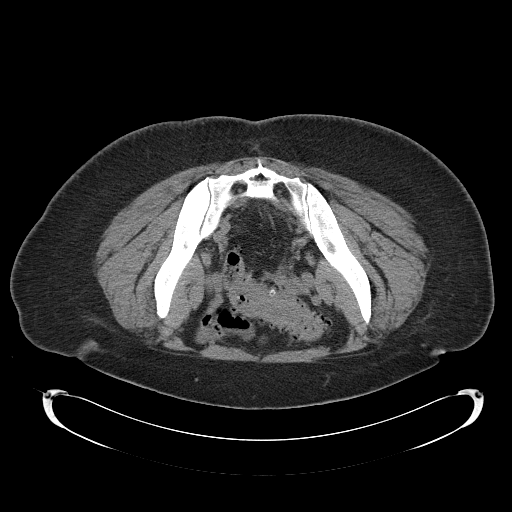
[im 7/21  soft-tissue]
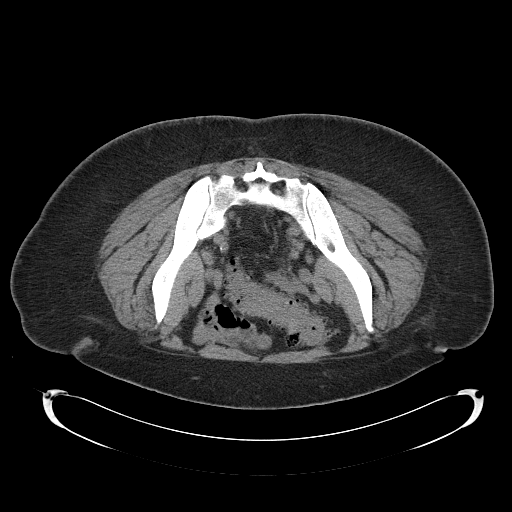
[im 9/21  soft-tissue]
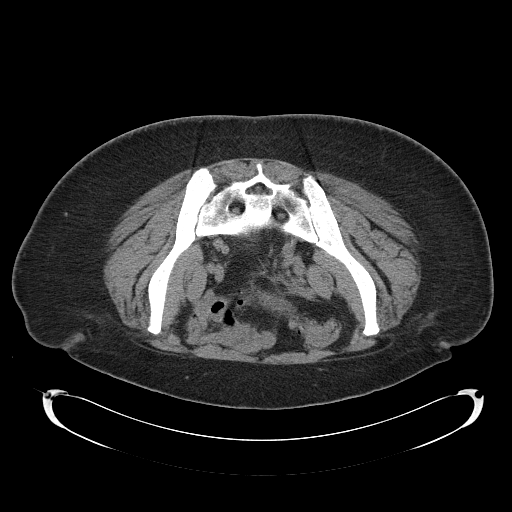
[im 11/21  soft-tissue]
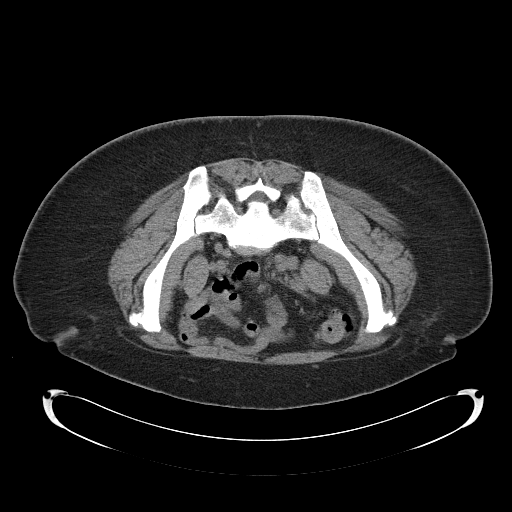
[im 12/21  soft-tissue]
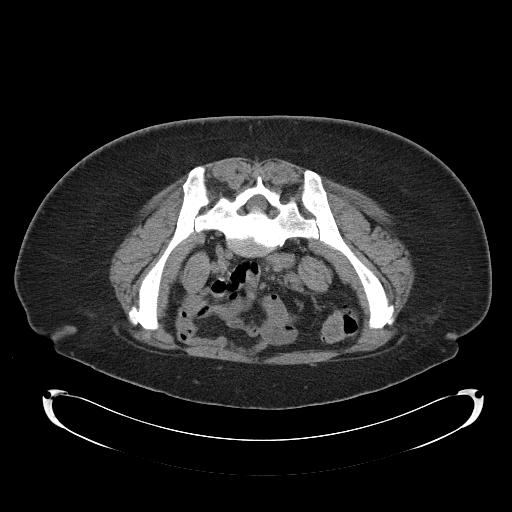
[im 14/21  soft-tissue]
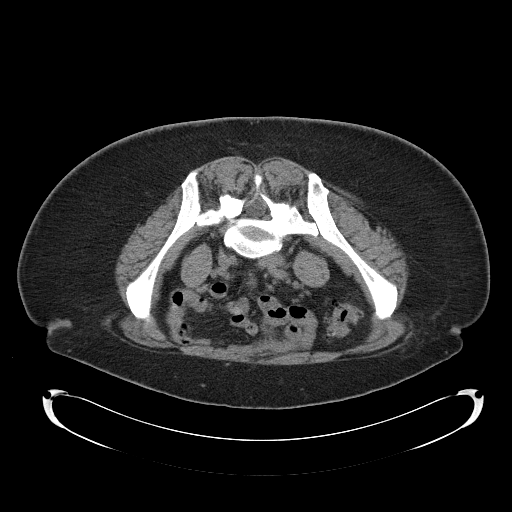
[im 14/21  bone]
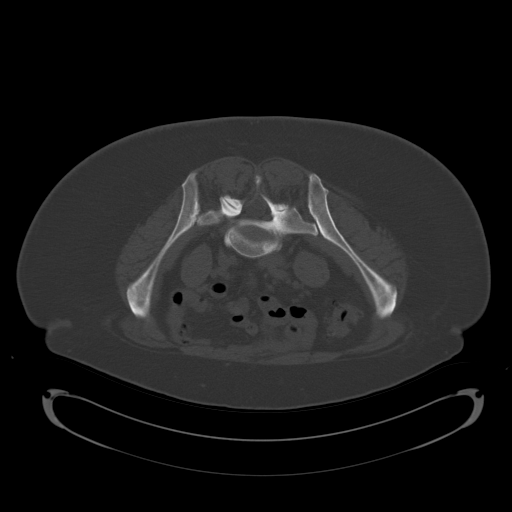
[im 15/21  soft-tissue]
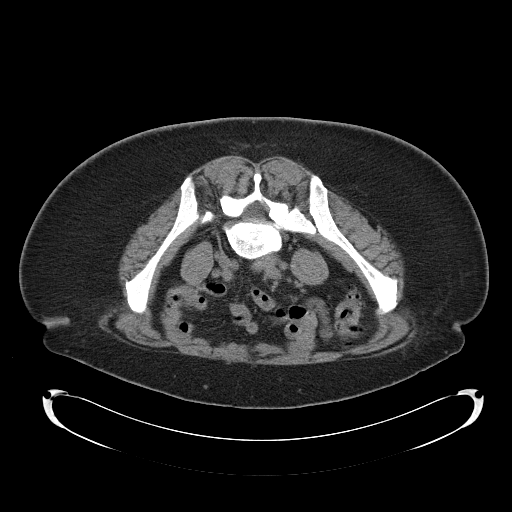
[im 16/21  soft-tissue]
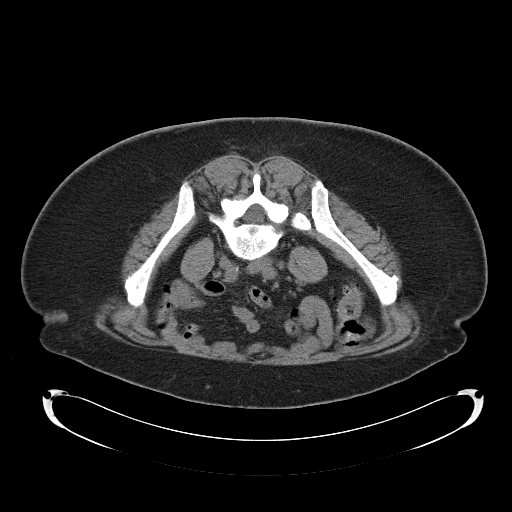
[im 16/21  lung]
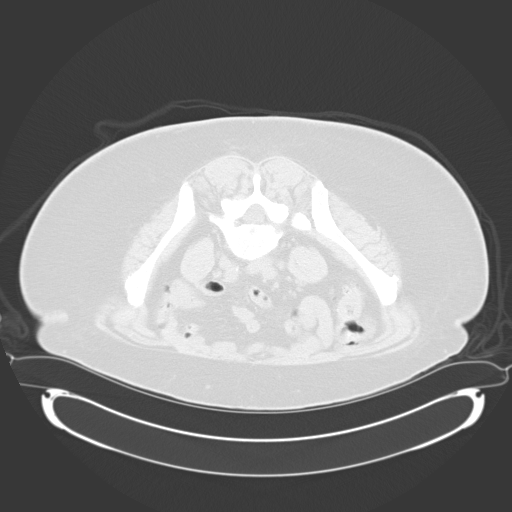
[im 17/21  lung]
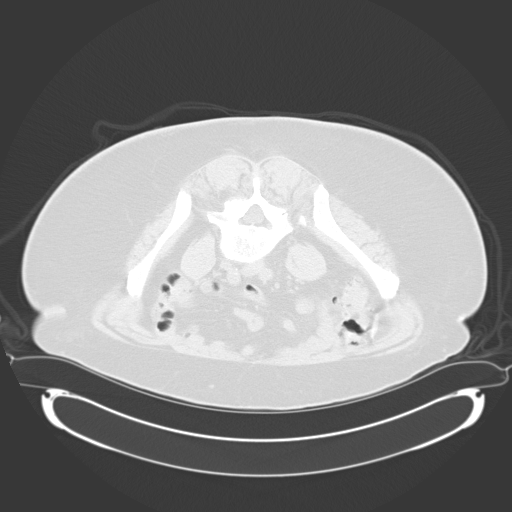
[im 18/21  soft-tissue]
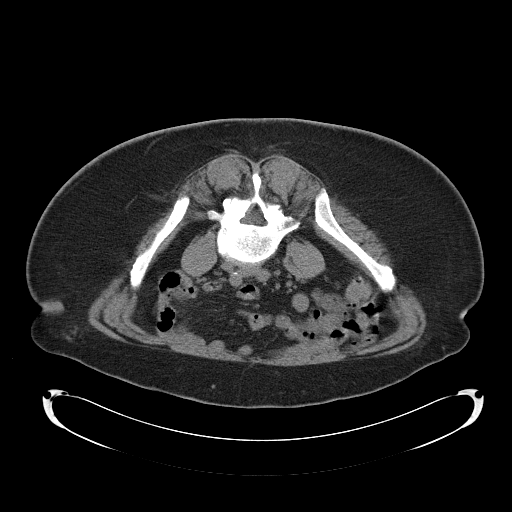
[im 18/21  lung]
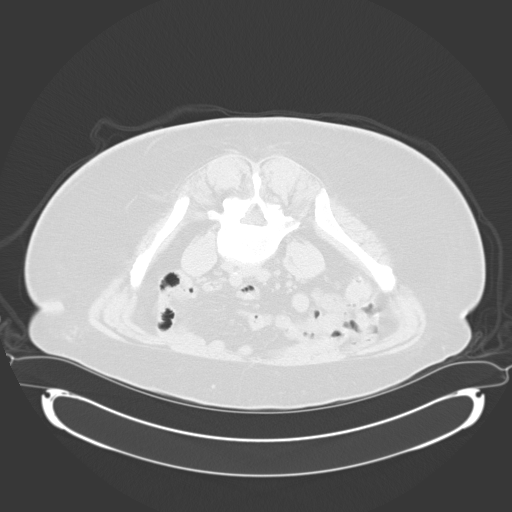
[im 19/21  soft-tissue]
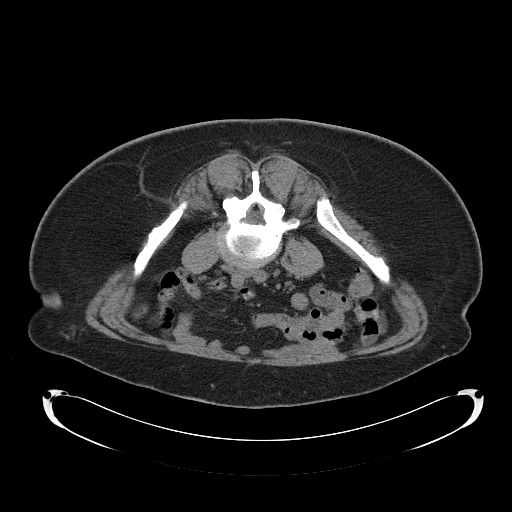
[im 19/21  lung]
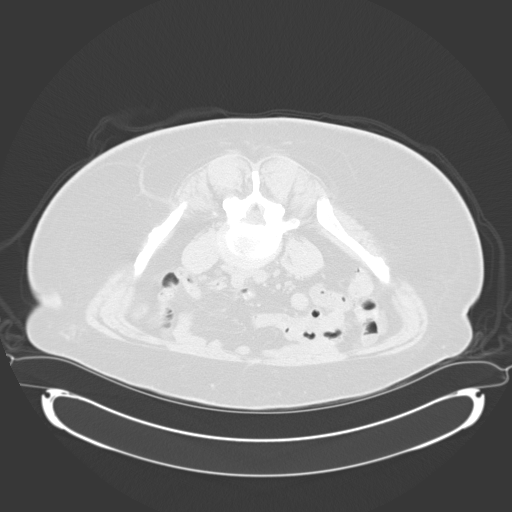

[14 of 32 positions shown; findings below may reference images not displayed]

CT GUIDED RIGHT ILIAC BONE MARROW ASPIRATION AND CORE BIOPSY

Date:  05/20/2010 [DATE]

Radiologist:  Yoel Tiger, M.D.

Medications:  2 mg Versed, 25 mcg Fentanyl

Guidance:  CT

Sedation time:  15 minutes

Contrast volume:  None.

Complications:  No immediate

PROCEDURE/FINDINGS:

Informed consent was obtained from the patient following
explanation of the procedure, risks, benefits and alternatives.
The patient understands, agrees and consents for the procedure.
All questions were addressed.  A time out was performed.

The patient was positioned prone and noncontrast localization CT
was performed of the pelvis to demonstrate the iliac marrow spaces.

Maximal barrier sterile technique utilized including caps, mask,
sterile gowns, sterile gloves, large sterile drape, hand hygiene,
and betadine prep.

Under sterile conditions and local anesthesia, an 11 gauge coaxial
bone biopsy needle was advanced into the right iliac marrow space.
Needle position was confirmed with CT imaging. Initially, bone
marrow aspiration was performed. Next, the 11 gauge outer cannula
was utilized to obtain a right iliac bone marrow core biopsy.
Needle was removed. Hemostasis was obtained with compression. The
patient tolerated the procedure well. Samples were prepared with
the cytotechnologist. No immediate complications.
IMPRESSION: CT guided right iliac bone marrow aspiration and core biopsy.

## 2010-12-27 ENCOUNTER — Encounter (HOSPITAL_BASED_OUTPATIENT_CLINIC_OR_DEPARTMENT_OTHER): Payer: Managed Care, Other (non HMO) | Admitting: Hematology & Oncology

## 2010-12-27 ENCOUNTER — Other Ambulatory Visit: Payer: Self-pay | Admitting: Hematology & Oncology

## 2010-12-27 DIAGNOSIS — Z5112 Encounter for antineoplastic immunotherapy: Secondary | ICD-10-CM

## 2010-12-27 DIAGNOSIS — C8589 Other specified types of non-Hodgkin lymphoma, extranodal and solid organ sites: Secondary | ICD-10-CM

## 2010-12-27 DIAGNOSIS — D509 Iron deficiency anemia, unspecified: Secondary | ICD-10-CM

## 2010-12-27 LAB — CBC WITH DIFFERENTIAL (CANCER CENTER ONLY)
BASO#: 0.1 10*3/uL (ref 0.0–0.2)
EOS%: 3.4 % (ref 0.0–7.0)
Eosinophils Absolute: 0.3 10*3/uL (ref 0.0–0.5)
LYMPH%: 30.5 % (ref 14.0–48.0)
MCH: 28.4 pg (ref 26.0–34.0)
MCHC: 35.7 g/dL (ref 32.0–36.0)
MCV: 80 fL — ABNORMAL LOW (ref 81–101)
MONO%: 5.7 % (ref 0.0–13.0)
NEUT#: 5.8 10*3/uL (ref 1.5–6.5)
Platelets: 492 10*3/uL — ABNORMAL HIGH (ref 145–400)

## 2010-12-29 LAB — SPEP & IFE WITH QIG
Albumin ELP: 41.3 % — ABNORMAL LOW (ref 55.8–66.1)
Alpha-2-Globulin: 16.4 % — ABNORMAL HIGH (ref 7.1–11.8)
Beta 2: 6.9 % — ABNORMAL HIGH (ref 3.2–6.5)
Beta Globulin: 5.7 % (ref 4.7–7.2)
IgA: 85 mg/dL (ref 68–378)
IgG (Immunoglobin G), Serum: 666 mg/dL — ABNORMAL LOW (ref 694–1618)
Total Protein, Serum Electrophoresis: 6.3 g/dL (ref 6.0–8.3)

## 2010-12-29 LAB — KAPPA/LAMBDA LIGHT CHAINS
Kappa free light chain: 24.3 mg/dL — ABNORMAL HIGH (ref 0.33–1.94)
Lambda Free Lght Chn: 125 mg/dL — ABNORMAL HIGH (ref 0.57–2.63)

## 2010-12-29 LAB — COMPREHENSIVE METABOLIC PANEL
Albumin: 2.9 g/dL — ABNORMAL LOW (ref 3.5–5.2)
BUN: 21 mg/dL (ref 6–23)
Calcium: 10.5 mg/dL (ref 8.4–10.5)
Chloride: 109 mEq/L (ref 96–112)
Creatinine, Ser: 2.27 mg/dL — ABNORMAL HIGH (ref 0.40–1.20)
Glucose, Bld: 87 mg/dL (ref 70–99)
Potassium: 4.4 mEq/L (ref 3.5–5.3)

## 2010-12-29 LAB — LACTATE DEHYDROGENASE: LDH: 196 U/L (ref 94–250)

## 2010-12-29 LAB — FERRITIN: Ferritin: 499 ng/mL — ABNORMAL HIGH (ref 10–291)

## 2011-01-10 ENCOUNTER — Other Ambulatory Visit: Payer: Self-pay | Admitting: Hematology & Oncology

## 2011-01-10 DIAGNOSIS — C8300 Small cell B-cell lymphoma, unspecified site: Secondary | ICD-10-CM

## 2011-01-20 ENCOUNTER — Other Ambulatory Visit: Payer: Self-pay | Admitting: Hematology & Oncology

## 2011-01-20 ENCOUNTER — Other Ambulatory Visit: Payer: Self-pay | Admitting: Diagnostic Radiology

## 2011-01-20 ENCOUNTER — Encounter (HOSPITAL_COMMUNITY): Payer: Self-pay

## 2011-01-20 ENCOUNTER — Ambulatory Visit (HOSPITAL_COMMUNITY)
Admission: RE | Admit: 2011-01-20 | Discharge: 2011-01-20 | Disposition: A | Discharge status: Home / Self Care | Payer: Managed Care, Other (non HMO) | Source: Ambulatory Visit | Attending: Hematology & Oncology | Admitting: Hematology & Oncology

## 2011-01-20 ENCOUNTER — Ambulatory Visit (HOSPITAL_COMMUNITY): Payer: Managed Care, Other (non HMO)

## 2011-01-20 DIAGNOSIS — C8589 Other specified types of non-Hodgkin lymphoma, extranodal and solid organ sites: Secondary | ICD-10-CM | POA: Insufficient documentation

## 2011-01-20 DIAGNOSIS — C8300 Small cell B-cell lymphoma, unspecified site: Secondary | ICD-10-CM

## 2011-01-20 HISTORY — DX: Malignant (primary) neoplasm, unspecified: C80.1

## 2011-01-20 LAB — PROTIME-INR
INR: 1.02 (ref 0.00–1.49)
Prothrombin Time: 13.6 seconds (ref 11.6–15.2)

## 2011-01-20 LAB — APTT: aPTT: 40 seconds — ABNORMAL HIGH (ref 24–37)

## 2011-01-20 LAB — CBC
Hemoglobin: 11.6 g/dL — ABNORMAL LOW (ref 12.0–15.0)
Platelets: 417 10*3/uL — ABNORMAL HIGH (ref 150–400)
RBC: 4.16 MIL/uL (ref 3.87–5.11)
WBC: 7.8 10*3/uL (ref 4.0–10.5)

## 2011-01-25 NOTE — Op Note (Signed)
NAMEALLISA, Shannon Murray           ACCOUNT NO.:  [id number]   MEDICAL RECORD NO.:  [id number]          PATIENT TYPE:  AMB   LOCATION:  ENDO                         FACILITY:  [facility]   PHYSICIAN:  Anselmo Rod, M.D.  DATE OF BIRTH:  Dec 09, 1945   DATE OF PROCEDURE:  11/19/2007  DATE OF DISCHARGE:  11/16/2007                               OPERATIVE REPORT   PROCEDURE PERFORMED:  Colonoscopy with snare polypectomy x 1.   ENDOSCOPIST:  Anselmo Rod, M.D.   INSTRUMENT USED:  Pentax video colonoscope.   INDICATIONS FOR PROCEDURE:  65 year old female undergoing screening  colonoscopy to rule out colonic polyps, masses, etc. The patient has a  history of multiple myeloma diagnosed in 2006.   PRE-PROCEDURE PREPARATION:  Informed consent was procured from the  patient. The patient was fasted for 4 hours prior to the procedure and  prepped with 20 OsmoPrep pills the night before and 12 OsmoPrep pills  the morning of the procedure.  The risks and benefits of the procedure  including a 10% miss rate of cancer and polyps were discussed with the  patient, as well.   PRE-PROCEDURE PHYSICAL:  The patient had stable vital signs. Neck was  supple. Chest clear to auscultation. S1 and S2 regular. Abdomen soft  with normal bowel sounds.   DESCRIPTION OF PROCEDURE:  The patient was placed in the left lateral  decubitus position and sedated with 75 mcg of fentanyl and 5 mg of  Versed given intravenously in slow incremental doses. Once the patient  was adequately sedated and maintained on low flow oxygen and continuous  cardiac monitoring, the Pentax video colonoscope was advanced from the  rectum to the cecum.  There was evidence of pan-diverticulosis with more  prominent changes in the left colon.  Small sessile polyps were snared  from the proximal right colon over the IC valve but the polyp was lost  in stool.  Multiple washes were done, but small lesions could be missed.  The appendiceal  orifice and cecal valve were visualized and  photographed.  Retroflexion in the rectum revealed no abnormalities.   IMPRESSION:  1. Small sessile polyp snared above the IC valve, the polyp was lost      in stool.  2. Pandiverticulosis with more prominent in the left colon.  3. A small amount of residual stool in the colon, small lesions could      be missed.   RECOMMENDATIONS:  1. Avoid all nonsteroidals for the next two weeks.  2. Repeat colonoscopy in the next five years. If the patient has any      abnormal symptoms in the interim she is to      contact the office immediately for further recommendations.  3. A high fiber diet with liberal fluid intake has been advocated.  4. Outpatient follow up as the need arises in the future.      Anselmo Rod, M.D.  Electronically Signed     JNM/MEDQ  D:  11/19/2007  T:  11/20/2007  Job:  [id number]   cc:   Candyce Churn. Allyne Gee, M.D.

## 2011-02-01 ENCOUNTER — Other Ambulatory Visit: Payer: Self-pay | Admitting: Hematology & Oncology

## 2011-02-01 ENCOUNTER — Encounter (HOSPITAL_BASED_OUTPATIENT_CLINIC_OR_DEPARTMENT_OTHER): Payer: Managed Care, Other (non HMO) | Admitting: Hematology & Oncology

## 2011-02-01 DIAGNOSIS — N289 Disorder of kidney and ureter, unspecified: Secondary | ICD-10-CM

## 2011-02-01 DIAGNOSIS — Z5112 Encounter for antineoplastic immunotherapy: Secondary | ICD-10-CM

## 2011-02-01 DIAGNOSIS — C8589 Other specified types of non-Hodgkin lymphoma, extranodal and solid organ sites: Secondary | ICD-10-CM

## 2011-02-01 DIAGNOSIS — Z5111 Encounter for antineoplastic chemotherapy: Secondary | ICD-10-CM

## 2011-02-01 LAB — CBC WITH DIFFERENTIAL (CANCER CENTER ONLY)
BASO%: 1.4 % (ref 0.0–2.0)
LYMPH#: 3 10*3/uL (ref 0.9–3.3)
LYMPH%: 37.3 % (ref 14.0–48.0)
MCV: 81 fL (ref 81–101)
MONO#: 0.5 10*3/uL (ref 0.1–0.9)
NEUT#: 4.1 10*3/uL (ref 1.5–6.5)
Platelets: 395 10*3/uL (ref 145–400)
RBC: 4.05 10*6/uL (ref 3.70–5.32)
RDW: 16.4 % — ABNORMAL HIGH (ref 11.1–15.7)
WBC: 8.1 10*3/uL (ref 3.9–10.0)

## 2011-02-01 LAB — CMP (CANCER CENTER ONLY)
ALT(SGPT): 13 U/L (ref 10–47)
Albumin: 2.5 g/dL — ABNORMAL LOW (ref 3.3–5.5)
Alkaline Phosphatase: 55 U/L (ref 26–84)
CO2: 27 mEq/L (ref 18–33)
Potassium: 4.5 mEq/L (ref 3.3–4.7)
Sodium: 136 mEq/L (ref 128–145)
Total Bilirubin: 0.5 mg/dl (ref 0.20–1.60)
Total Protein: 6.5 g/dL (ref 6.4–8.1)

## 2011-02-02 ENCOUNTER — Encounter (HOSPITAL_BASED_OUTPATIENT_CLINIC_OR_DEPARTMENT_OTHER): Payer: Managed Care, Other (non HMO) | Admitting: Hematology & Oncology

## 2011-02-02 DIAGNOSIS — C8589 Other specified types of non-Hodgkin lymphoma, extranodal and solid organ sites: Secondary | ICD-10-CM

## 2011-02-02 DIAGNOSIS — Z5111 Encounter for antineoplastic chemotherapy: Secondary | ICD-10-CM

## 2011-02-03 LAB — SPEP & IFE WITH QIG
IgA: 80 mg/dL (ref 68–378)
IgG (Immunoglobin G), Serum: 706 mg/dL (ref 694–1618)
M-Spike, %: 1.03 g/dL
Total Protein, Serum Electrophoresis: 6.5 g/dL (ref 6.0–8.3)

## 2011-02-03 LAB — FERRITIN: Ferritin: 417 ng/mL — ABNORMAL HIGH (ref 10–291)

## 2011-07-14 ENCOUNTER — Other Ambulatory Visit: Payer: Self-pay | Admitting: Hematology & Oncology

## 2011-07-14 ENCOUNTER — Encounter (HOSPITAL_BASED_OUTPATIENT_CLINIC_OR_DEPARTMENT_OTHER): Payer: Medicare Other | Admitting: Hematology & Oncology

## 2011-07-14 DIAGNOSIS — D509 Iron deficiency anemia, unspecified: Secondary | ICD-10-CM

## 2011-07-14 DIAGNOSIS — C8589 Other specified types of non-Hodgkin lymphoma, extranodal and solid organ sites: Secondary | ICD-10-CM

## 2011-07-14 DIAGNOSIS — Z5112 Encounter for antineoplastic immunotherapy: Secondary | ICD-10-CM

## 2011-07-14 LAB — CMP (CANCER CENTER ONLY)
AST: 20 U/L (ref 11–38)
Albumin: 2.8 g/dL — ABNORMAL LOW (ref 3.3–5.5)
Alkaline Phosphatase: 59 U/L (ref 26–84)
Potassium: 4.9 mEq/L — ABNORMAL HIGH (ref 3.3–4.7)
Sodium: 145 mEq/L (ref 128–145)
Total Protein: 7.4 g/dL (ref 6.4–8.1)

## 2011-07-14 LAB — CBC WITH DIFFERENTIAL (CANCER CENTER ONLY)
EOS%: 2.9 % (ref 0.0–7.0)
MCH: 29.1 pg (ref 26.0–34.0)
MCHC: 34.4 g/dL (ref 32.0–36.0)
MONO%: 7.4 % (ref 0.0–13.0)
NEUT#: 4 10*3/uL (ref 1.5–6.5)
Platelets: 328 10*3/uL (ref 145–400)
RBC: 4.09 10*6/uL (ref 3.70–5.32)

## 2011-07-17 NOTE — Progress Notes (Signed)
DIAGNOSES: 1. Recurrent lymphoplasmacytic lymphoma. 2. Acute renal failure status post emergent abdominal surgery for     diverticulitis up in [place].  CURRENT THERAPY:  Observation.  INTERIM HISTORY:  Shannon Murray comes in for a long awaited visit. She unfortunately was hospitalized for 2-3 months up in [place].  She was up visiting for a dance recital.  She developed diverticulitis that required emergent surgery.  She had a tough time with surgery.  She developed renal failure.  Thankfully, she was not put on dialysis.  She was just very sick.  She was in the hospital for, I think, a couple of months.  She needed rehabilitation.  She finally made it down to [place].  She had lost about 20 pounds. Her appetite is starting to come back.  She saw numerous doctors up in the [place].  I think she was seen by a hematologist.  It appeared that everything seemed to be relatively stable with the lymphoplasmacytic lymphoma.  She has not had treatment since May.  At that point in time, her monoclonal spike was 1.03 g/dL.  Her IgM level was 1540 mg/dL.  Her serum free lambda light chain was 59 mg/dL.  She may move up to [place] permanently.  She has to go back in late November and December to see her doctors up there.  She has a colostomy that needs to be reversed.  This may be in late December.  She does not have any pain.  No cough or shortness of breath.  There has been no leg swelling.  She is urinating okay.  She has not noted any problems with fever, sweats or chills.  Overall, her performance status is ECOG 1.  PHYSICAL EXAMINATION:  General:  This is a well-developed, well- nourished black female in no obvious distress.  Vital Signs: Temperature 97.5, pulse 70, respiratory rate 18, blood pressure 138/81. Weight is 171.  Head/Neck:  Normocephalic, atraumatic skull.  There are no ocular or oral lesions.  There are no palpable cervical or supraclavicular lymph nodes.   Lungs:  Clear bilaterally.  Cardiac: Regular rate and rhythm with a normal S1, S2.  There are no murmurs, rubs or bruits.  Abdomen:  Soft with good bowel sounds.  There is no palpable abdominal mass.  There is no fluid wave.  There is no palpable hepatosplenomegaly.  She does have a colostomy that is intact in the left lower quadrant.  Extremities:  No clubbing, cyanosis or edema. Neurologic:  No focal neurological deficits.  LABORATORY STUDIES:  White cell count of 8.7, hemoglobin 11.9, hematocrit 34.6, platelet count is 328.  Sodium 145, potassium 4.9, BUN 50, creatinine 4.7.  Calcium 10.7 with an albumin of 2.8.  IMPRESSION:  Shannon Murray is a 65 year old African female with lymphoplasmacytic lymphoma.  We had her on chemotherapy.  She unfortunately developed a critical illness and had a long complicated recovery.  She looks really good.  She actually looks much better than I would have thought she would.  Her electrolytes do not look all that great, however.  BUN and creatinine are quite high.  Somehow I need to find out what her baseline was after she left [place].  I will also see what her protein/myeloma type panel is.  This may give me an idea as to whether or not we need to start her on treatment again.  I am just happy that she got through her event.  We will plan for her to follow up  after the holidays unless we need to get back sooner for therapy.    ______________________________ Josph Macho, M.D. PRE/MEDQ  D:  07/14/2011  T:  07/17/2011  Job:  [id number]  ADDENDUM:  M-Spike is 0.83 g/dL. IgM is 1300 mg/dL.  K/L are 9.32/14

## 2011-07-18 LAB — SPEP & IFE WITH QIG
Beta Globulin: 5.1 % (ref 4.7–7.2)
IgA: 132 mg/dL (ref 69–380)
IgG (Immunoglobin G), Serum: 1150 mg/dL (ref 690–1700)
IgM, Serum: 1300 mg/dL — ABNORMAL HIGH (ref 52–322)
M-Spike, %: 0.83 g/dL
Total Protein, Serum Electrophoresis: 7.1 g/dL (ref 6.0–8.3)

## 2011-08-15 IMAGING — CT CT BIOPSY
1 of 5 series · 9 of 32 positions shown, 15 images · non-contrast
Comparison: none

CLINICAL HISTORY: Lymphoma.

[Series 2: rtn ap without · axial · non-contrast · 0.52mm/px · z∈[-345,-265]mm · 9 of 21 slices shown, 15 images]
[im 3/21  soft-tissue]
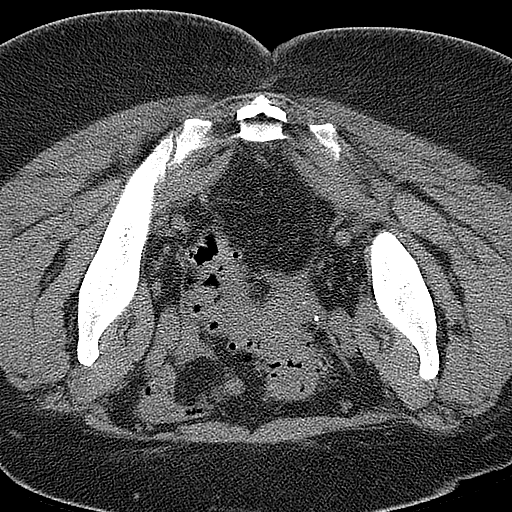
[im 3/21  bone]
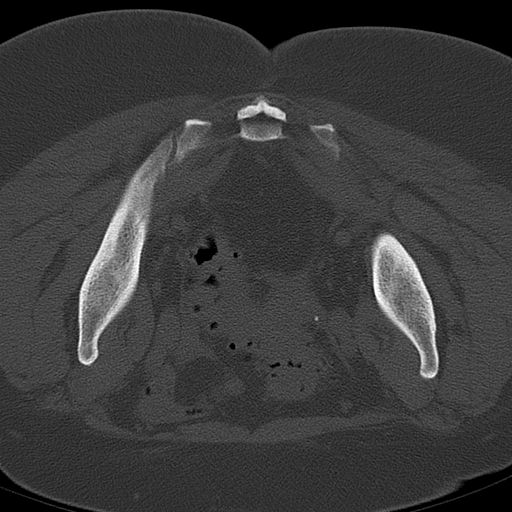
[im 5/21  soft-tissue]
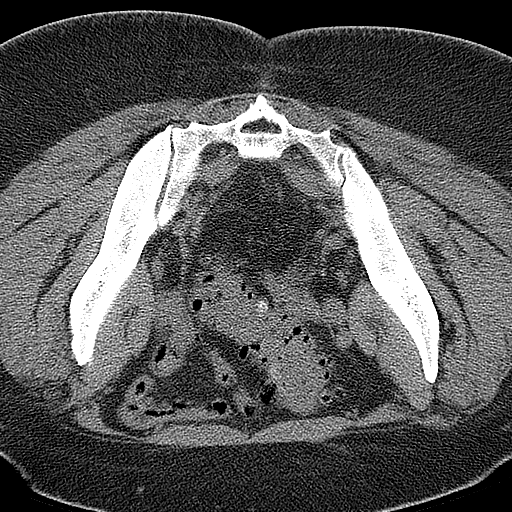
[im 7/21  soft-tissue]
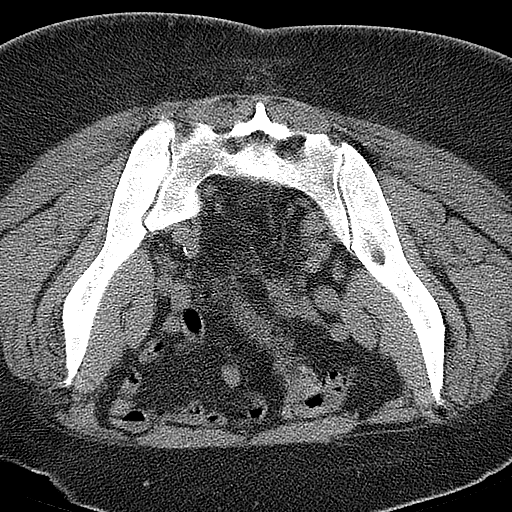
[im 9/21  soft-tissue]
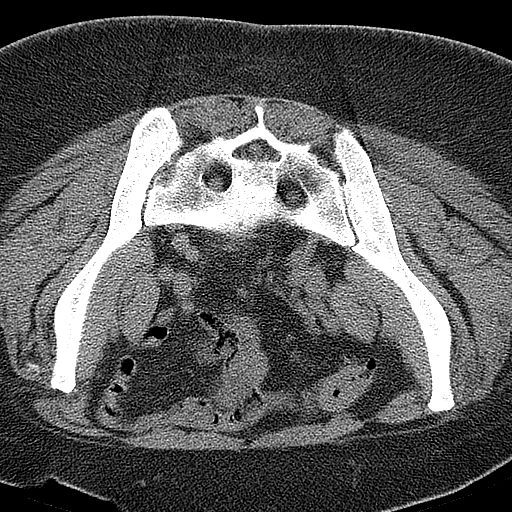
[im 11/21  soft-tissue]
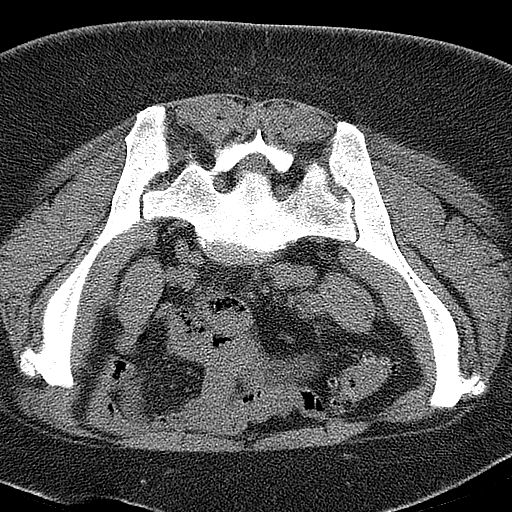
[im 13/21  soft-tissue]
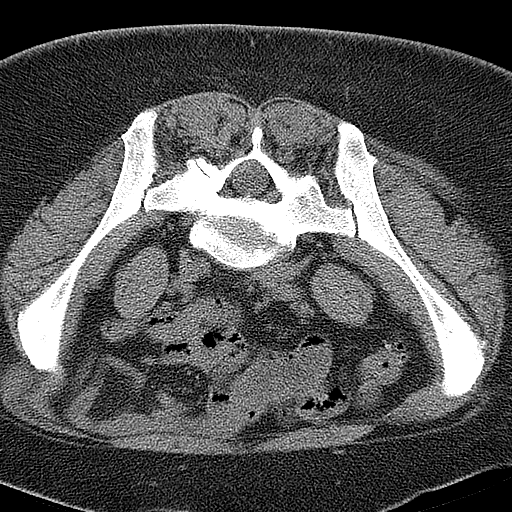
[im 13/21  lung]
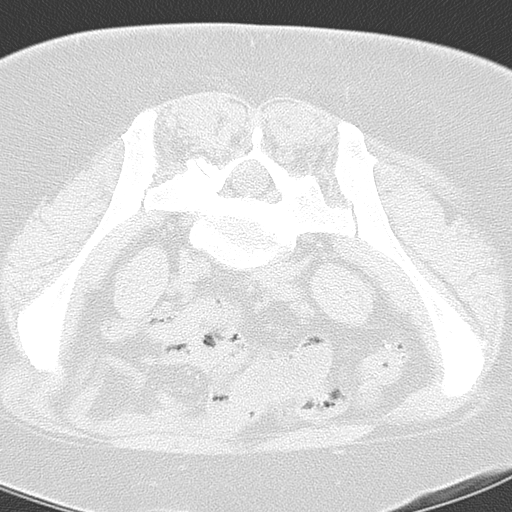
[im 15/21  soft-tissue]
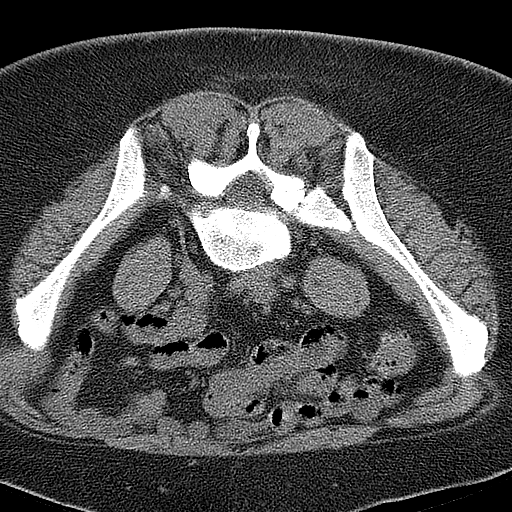
[im 15/21  lung]
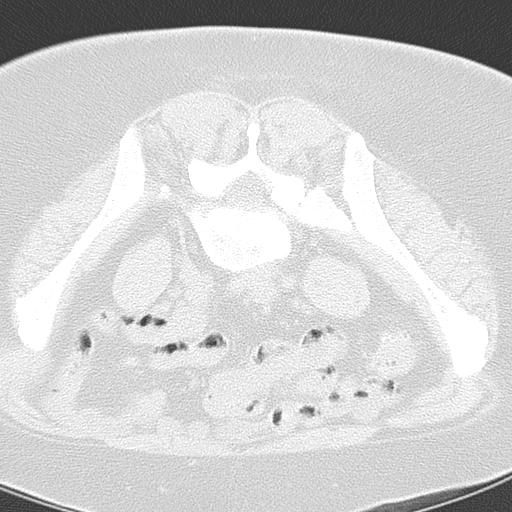
[im 17/21  soft-tissue]
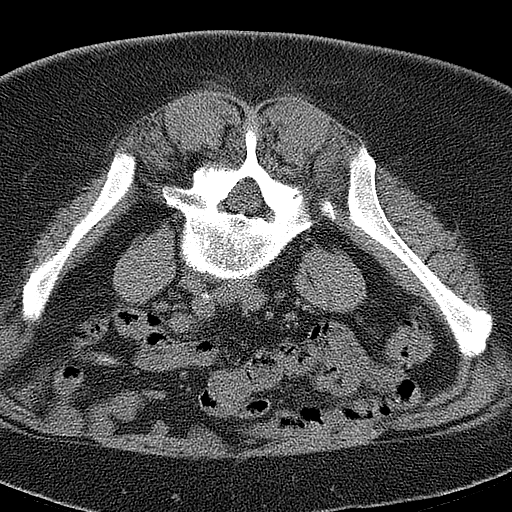
[im 17/21  lung]
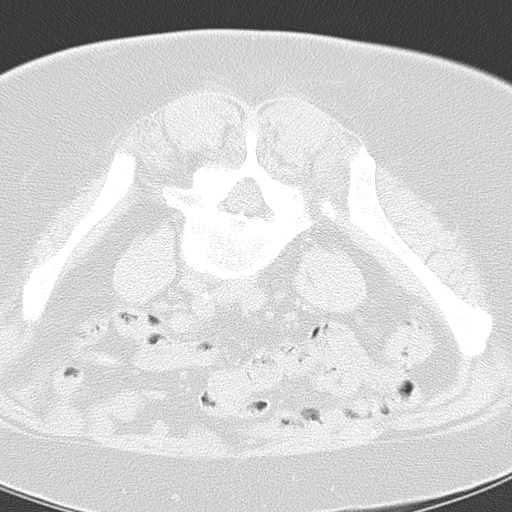
[im 19/21  soft-tissue]
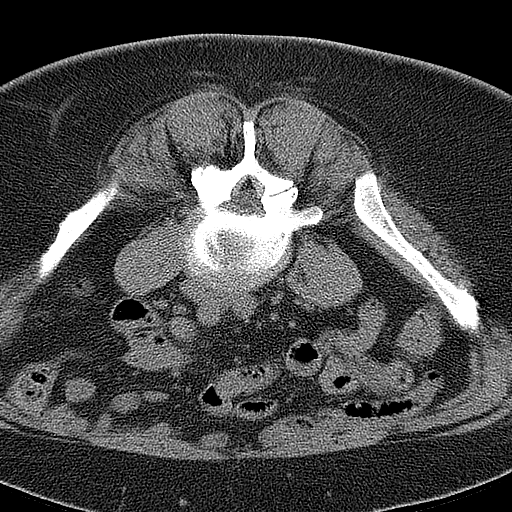
[im 19/21  lung]
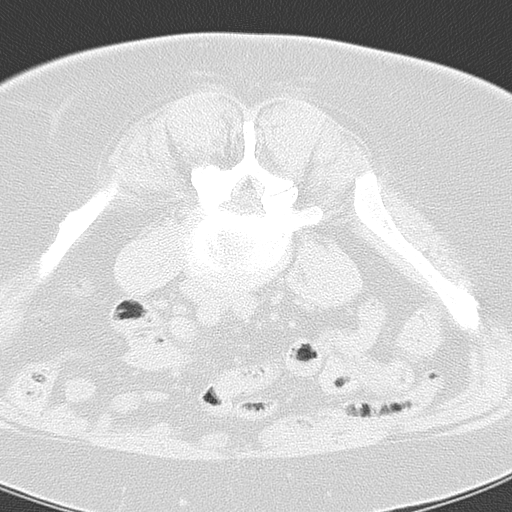
[im 19/21  bone]
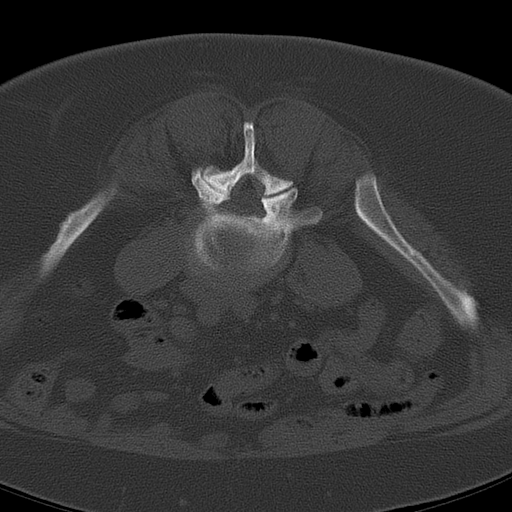

[9 of 32 positions shown; findings below may reference images not displayed]

PROCEDURE(S): CT GUIDED BONE MARROW BIOPSY

Medications:Versed 2 mg, Fentanyl 100 mcg. A radiology nurse
monitored the patient for moderate sedation.

Moderate sedation time:12 minutes

Procedure:The procedure was explained to the patient.  The risks
and benefits of the procedure were discussed and the patient's
questions were addressed.  Informed consent was obtained from the
patient.  The patient was placed prone on the CT scanner.  Images
of the pelvis were obtained.  The skin and posterior right iliac
bone were anesthetized with 1% lidocaine.  A 11 gauge bone needle
was directed into the posterior right iliac bone with CT guidance.
Two aspirates and two core biopsies were obtained.  Core biopsies
were obtained with the 11 gauge needle.  No immediate complication.
FINDINGS: Needle was directed into the posterior right iliac bone.

Complications: None
IMPRESSION: CT guided bone marrow biopsy.

## 2011-09-23 ENCOUNTER — Ambulatory Visit (HOSPITAL_BASED_OUTPATIENT_CLINIC_OR_DEPARTMENT_OTHER): Payer: Managed Care, Other (non HMO) | Admitting: Lab

## 2011-09-23 ENCOUNTER — Encounter: Payer: Self-pay | Admitting: Hematology & Oncology

## 2011-09-23 ENCOUNTER — Ambulatory Visit (HOSPITAL_BASED_OUTPATIENT_CLINIC_OR_DEPARTMENT_OTHER): Payer: Managed Care, Other (non HMO) | Admitting: Hematology & Oncology

## 2011-09-23 DIAGNOSIS — C8589 Other specified types of non-Hodgkin lymphoma, extranodal and solid organ sites: Secondary | ICD-10-CM

## 2011-09-23 DIAGNOSIS — N19 Unspecified kidney failure: Secondary | ICD-10-CM

## 2011-09-23 DIAGNOSIS — C8800 Waldenstrom macroglobulinemia not having achieved remission: Secondary | ICD-10-CM

## 2011-09-23 DIAGNOSIS — K5732 Diverticulitis of large intestine without perforation or abscess without bleeding: Secondary | ICD-10-CM

## 2011-09-23 HISTORY — DX: Waldenstrom macroglobulinemia not having achieved remission: C88.00

## 2011-09-23 HISTORY — DX: Unspecified kidney failure: N19

## 2011-09-23 HISTORY — DX: Diverticulitis of large intestine without perforation or abscess without bleeding: K57.32

## 2011-09-23 HISTORY — DX: Waldenstrom macroglobulinemia: C88.0

## 2011-09-23 LAB — CBC WITH DIFFERENTIAL (CANCER CENTER ONLY)
EOS%: 2.4 % (ref 0.0–7.0)
Eosinophils Absolute: 0.2 10*3/uL (ref 0.0–0.5)
LYMPH#: 3 10*3/uL (ref 0.9–3.3)
MCH: 29.6 pg (ref 26.0–34.0)
MONO%: 5.3 % (ref 0.0–13.0)
NEUT#: 4.3 10*3/uL (ref 1.5–6.5)
Platelets: 334 10*3/uL (ref 145–400)
RBC: 3.99 10*6/uL (ref 3.70–5.32)
WBC: 8 10*3/uL (ref 3.9–10.0)

## 2011-09-23 NOTE — Progress Notes (Signed)
This office note has been dictated.

## 2011-09-24 NOTE — Progress Notes (Signed)
DIAGNOSES: 1. Recurrent lymphoplasmacytic lymphoma, low level recurrence. 2. Acute renal failure.  CURRENT THERAPY:  Observation.  INTERIM HISTORY:  [name] comes in for followup.  Unfortunately, this will be her last visit with [place].  She is moving back up to [place].  She has family up there.  She is happy with the doctors that she sees up there.  I really cannot argue with her on that.  When we last saw her in November, her lab work really looked good.  Her monoclonal spike was only 0.83 mg/dL.  Her IgM level was 1300 mg/dL. Her serum free lambda light chain was 14 mg/dL.  These were all better than when we had seen her previously.  She had a fistula placed in her right arm.  She will be starting dialysis.  She is making urine.  She has a diverting colostomy.  She wants to have this reversed when she goes back up to [place].  Her appetite is doing okay.  She has had no nausea or vomiting.  She has had no fever.  There has been no leg swelling.  She has had no rashes.  PHYSICAL EXAMINATION:  General:  This is a well-developed, well- nourished black female in no obvious distress.  Vital Signs: Temperature 97.2, pulse 80, respiratory rate 14, blood pressure 138/72, weight is 176.  Head and Neck Exam:  Shows a normocephalic, atraumatic skull.  There are no ocular or oral lesions.  There are no palpable cervical or supraclavicular lymph nodes.  Lungs:  Clear bilaterally. Cardiac Exam:  Regular rate and rhythm with a normal S1 and S2.  There are no murmurs, rubs, or bruits.  Abdominal Exam:  Soft with good bowel sounds.  She has a colostomy in the left lower quadrant.  She has no fluid wave.  There is no guarding or rebound tenderness.  There is no palpable hepatosplenomegaly.  Back Exam:  No tenderness of the spine, ribs, or hips.  Extremities:  Show no clubbing, cyanosis, or edema. Neurological Exam:  Shows no focal neurological deficits.  LABORATORY STUDIES:  White cell count  is 8, hemoglobin 11.8, hematocrit 34.3, platelet count 334.  IMPRESSION:  [name] is a 66 year old African American female with recurrent lymphoplasmacytic lymphoma.  She actually is doing very well with this.  The recurrence is low level.  From my point of view, she is asymptomatic with this.  I am very surprised that she is not anemic, despite her renal failure.  We will certainly miss [name].  She really was a joy to help take care of.  She is a very tough woman.  I know that she will do well up in [place].  She is going to try to keep her house down here.  She will try to come down when she can.  We will be more than happy to help her out in any way that we can.    ______________________________ Josph Macho, M.D. PRE/MEDQ  D:  09/23/2011  T:  09/24/2011  Job:  [id number]  ADDENDUM:  M-Spike is 0.9 g/dL.  IgM is 1450 mg/dL.  LAMBDA is 16.50 mg/dL

## 2011-09-27 LAB — COMPREHENSIVE METABOLIC PANEL
AST: 16 U/L (ref 0–37)
Alkaline Phosphatase: 59 U/L (ref 39–117)
BUN: 60 mg/dL — ABNORMAL HIGH (ref 6–23)
Creatinine, Ser: 4.83 mg/dL — ABNORMAL HIGH (ref 0.50–1.10)
Total Bilirubin: 0.3 mg/dL (ref 0.3–1.2)

## 2011-09-27 LAB — SPEP & IFE WITH QIG
Alpha-1-Globulin: 5.1 % — ABNORMAL HIGH (ref 2.9–4.9)
Beta 2: 5 % (ref 3.2–6.5)
Gamma Globulin: 25.1 % — ABNORMAL HIGH (ref 11.1–18.8)
IgA: 100 mg/dL (ref 69–380)
IgG (Immunoglobin G), Serum: 1210 mg/dL (ref 690–1700)
IgM, Serum: 1450 mg/dL — ABNORMAL HIGH (ref 52–322)

## 2011-09-27 LAB — KAPPA/LAMBDA LIGHT CHAINS
Kappa:Lambda Ratio: 0.48 (ref 0.26–1.65)
Lambda Free Lght Chn: 16.5 mg/dL — ABNORMAL HIGH (ref 0.57–2.63)

## 2011-11-23 ENCOUNTER — Other Ambulatory Visit: Payer: Self-pay | Admitting: Obstetrics and Gynecology

## 2011-11-23 DIAGNOSIS — Z1231 Encounter for screening mammogram for malignant neoplasm of breast: Secondary | ICD-10-CM

## 2012-01-24 ENCOUNTER — Ambulatory Visit: Payer: Self-pay | Admitting: Obstetrics and Gynecology

## 2012-01-24 ENCOUNTER — Ambulatory Visit: Payer: Medicare Other

## 2012-02-29 ENCOUNTER — Ambulatory Visit
Admission: RE | Admit: 2012-02-29 | Discharge: 2012-02-29 | Disposition: A | Discharge status: Home / Self Care | Payer: Medicare Other | Source: Ambulatory Visit | Attending: Obstetrics and Gynecology | Admitting: Obstetrics and Gynecology

## 2012-02-29 DIAGNOSIS — Z1231 Encounter for screening mammogram for malignant neoplasm of breast: Secondary | ICD-10-CM

## 2012-03-01 ENCOUNTER — Encounter: Payer: Self-pay | Admitting: Obstetrics and Gynecology

## 2012-03-14 ENCOUNTER — Ambulatory Visit: Payer: Self-pay | Admitting: Obstetrics and Gynecology

## 2012-03-27 ENCOUNTER — Encounter: Payer: Self-pay | Admitting: Obstetrics and Gynecology

## 2012-03-27 ENCOUNTER — Ambulatory Visit (INDEPENDENT_AMBULATORY_CARE_PROVIDER_SITE_OTHER): Payer: Medicare Other | Admitting: Obstetrics and Gynecology

## 2012-03-27 VITALS — BP 122/70 | Ht 65.0 in | Wt 156.0 lb

## 2012-03-27 DIAGNOSIS — Z124 Encounter for screening for malignant neoplasm of cervix: Secondary | ICD-10-CM

## 2012-03-27 DIAGNOSIS — Z780 Asymptomatic menopausal state: Secondary | ICD-10-CM

## 2012-03-27 NOTE — Progress Notes (Signed)
The patient is not taking hormone replacement therapy The patient  is not taking a Calcium supplement. Post-menopausal bleeding:no Hyst   Last Pap: was normal April  2012 Last mammogram: was normal June  2013 Last DEXA scan : T= pt stated she had dexa done unsure ?  Last colonoscopy:normal 2009  Urinary symptoms: none Normal bowel movements: Yes Reports abuse at home: No:  .pmPhysical Examination: Neck - supple, no significant adenopathy Chest - clear to auscultation, no wheezes, rales or rhonchi, symmetric air entry Heart - normal rate, regular rhythm, normal S1, S2, no murmurs, rubs, clicks or gallops Abdomen - soft, nontender, nondistended, no masses or organomegaly Breasts - breasts appear normal, no suspicious masses, no skin or nipple changes or axillary nodes Pelvic - normal external genitalia, vulva, vagina, cervix, uterus and adnexa, atrophic Rectal - def per pt Musculoskeletal - no joint tenderness, deformity or swelling Extremities - peripheral pulses normal, no pedal edema, no clubbing or cyanosis Skin - normal coloration and turgor, no rashes, no suspicious skin lesions noted Normal AEX Pt due for mammogram no colonoscopy due no Pap done yes,  Repeat in one year.  dexa ordered.   RT one year Diet and exercise discussed  Pt had partial colon resection, colostomy and then revision for diverticulitis over the last year.  She is still recovering and is stable

## 2012-03-28 ENCOUNTER — Other Ambulatory Visit (INDEPENDENT_AMBULATORY_CARE_PROVIDER_SITE_OTHER): Payer: Medicare Other

## 2012-03-28 DIAGNOSIS — Z124 Encounter for screening for malignant neoplasm of cervix: Secondary | ICD-10-CM

## 2012-03-28 DIAGNOSIS — Z780 Asymptomatic menopausal state: Secondary | ICD-10-CM

## 2012-03-28 LAB — PAP IG W/ RFLX HPV ASCU

## 2012-03-29 ENCOUNTER — Telehealth: Payer: Self-pay

## 2012-03-29 NOTE — Telephone Encounter (Signed)
Spoke with pt rgd labs informed pap showed abnl cells need colpo explained colpo pt has appt 04/27/12 at 11:00 with ND pt voice understanding

## 2012-03-29 NOTE — Telephone Encounter (Signed)
Message copied by Rolla Plate on Thu Mar 29, 2012 10:36 AM ------      Message from: Jaymes Graff      Created: Wed Mar 28, 2012 11:48 PM       Please schedule pt for colposcopy.

## 2012-04-09 ENCOUNTER — Telehealth: Payer: Self-pay

## 2012-04-09 NOTE — Telephone Encounter (Signed)
Message copied by Rolla Plate on Mon Apr 09, 2012 12:27 PM ------      Message from: Jaymes Graff      Created: Sun Apr 08, 2012 11:22 PM       Please schedule pt for colposcopy.

## 2012-04-18 ENCOUNTER — Telehealth: Payer: Self-pay | Admitting: Obstetrics and Gynecology

## 2012-04-18 NOTE — Telephone Encounter (Signed)
Lm on vm tcb to r/s colpo

## 2012-04-19 NOTE — Telephone Encounter (Signed)
Pt can keep original appt for 04/27/12 no need to r/s

## 2012-04-27 ENCOUNTER — Encounter: Payer: Medicare Other | Admitting: Obstetrics and Gynecology

## 2012-04-27 ENCOUNTER — Ambulatory Visit (INDEPENDENT_AMBULATORY_CARE_PROVIDER_SITE_OTHER): Payer: Medicare Other | Admitting: Obstetrics and Gynecology

## 2012-04-27 ENCOUNTER — Encounter: Payer: Self-pay | Admitting: Obstetrics and Gynecology

## 2012-04-27 VITALS — BP 136/70 | Wt 156.0 lb

## 2012-04-27 DIAGNOSIS — R87611 Atypical squamous cells cannot exclude high grade squamous intraepithelial lesion on cytologic smear of cervix (ASC-H): Secondary | ICD-10-CM

## 2012-04-27 NOTE — Progress Notes (Signed)
Previous Pap Smear: 03/27/12 EPITHELIAL CELL ABNORMALITY: SQUAMOUS CELLS ATYPICAL SQUAMOUS CELLS, CANNOT EXCLUDE HIGH  GRADE SQUAMOUS INTRAEPITHELIAL LESION (ASC-H).  Previous Colposcopy: 01/26/10 ASCUS Referred From: n/a LMP: n/a Contraception: hysterectomy G,P: 6, 3 colpo done Aw lesion at the cuff.  bx done there Will call pt with the results

## 2012-05-07 ENCOUNTER — Telehealth: Payer: Self-pay

## 2012-05-07 NOTE — Telephone Encounter (Signed)
Spoke with pt rgs labr informed colpo result repeat pap every 6 months pt voice understanding

## 2012-05-07 NOTE — Telephone Encounter (Signed)
Message copied by Rolla Plate on Mon May 07, 2012 10:09 AM ------      Message from: Jaymes Graff      Created: Fri May 04, 2012 10:29 PM       Please review colpo results with patient and tell her I recommend a pap every six months for the next year.

## 2012-05-15 ENCOUNTER — Encounter: Payer: Self-pay | Admitting: Obstetrics and Gynecology

## 2013-04-08 ENCOUNTER — Other Ambulatory Visit: Payer: Self-pay

## 2013-04-08 DIAGNOSIS — Z1231 Encounter for screening mammogram for malignant neoplasm of breast: Secondary | ICD-10-CM

## 2013-05-15 ENCOUNTER — Ambulatory Visit
Admission: RE | Admit: 2013-05-15 | Discharge: 2013-05-15 | Disposition: A | Discharge status: Home / Self Care | Payer: Medicare Other | Source: Ambulatory Visit

## 2013-05-15 DIAGNOSIS — Z1231 Encounter for screening mammogram for malignant neoplasm of breast: Secondary | ICD-10-CM

## 2013-09-12 DIAGNOSIS — N186 End stage renal disease: Secondary | ICD-10-CM | POA: Diagnosis not present

## 2013-09-13 DIAGNOSIS — D631 Anemia in chronic kidney disease: Secondary | ICD-10-CM | POA: Diagnosis not present

## 2013-09-13 DIAGNOSIS — N186 End stage renal disease: Secondary | ICD-10-CM | POA: Diagnosis not present

## 2013-09-16 DIAGNOSIS — D631 Anemia in chronic kidney disease: Secondary | ICD-10-CM | POA: Diagnosis not present

## 2013-09-16 DIAGNOSIS — C880 Waldenstrom macroglobulinemia: Secondary | ICD-10-CM | POA: Diagnosis not present

## 2013-09-16 DIAGNOSIS — N185 Chronic kidney disease, stage 5: Secondary | ICD-10-CM | POA: Diagnosis not present

## 2013-09-16 DIAGNOSIS — N186 End stage renal disease: Secondary | ICD-10-CM | POA: Diagnosis not present

## 2013-09-18 DIAGNOSIS — D631 Anemia in chronic kidney disease: Secondary | ICD-10-CM | POA: Diagnosis not present

## 2013-09-18 DIAGNOSIS — N186 End stage renal disease: Secondary | ICD-10-CM | POA: Diagnosis not present

## 2013-09-18 DIAGNOSIS — E215 Disorder of parathyroid gland, unspecified: Secondary | ICD-10-CM | POA: Diagnosis not present

## 2013-09-18 DIAGNOSIS — D539 Nutritional anemia, unspecified: Secondary | ICD-10-CM | POA: Diagnosis not present

## 2013-09-18 DIAGNOSIS — R7989 Other specified abnormal findings of blood chemistry: Secondary | ICD-10-CM | POA: Diagnosis not present

## 2013-09-18 DIAGNOSIS — B199 Unspecified viral hepatitis without hepatic coma: Secondary | ICD-10-CM | POA: Diagnosis not present

## 2013-09-20 DIAGNOSIS — D631 Anemia in chronic kidney disease: Secondary | ICD-10-CM | POA: Diagnosis not present

## 2013-09-20 DIAGNOSIS — N186 End stage renal disease: Secondary | ICD-10-CM | POA: Diagnosis not present

## 2013-09-23 DIAGNOSIS — D631 Anemia in chronic kidney disease: Secondary | ICD-10-CM | POA: Diagnosis not present

## 2013-09-23 DIAGNOSIS — N186 End stage renal disease: Secondary | ICD-10-CM | POA: Diagnosis not present

## 2013-09-25 DIAGNOSIS — D631 Anemia in chronic kidney disease: Secondary | ICD-10-CM | POA: Diagnosis not present

## 2013-09-25 DIAGNOSIS — N039 Chronic nephritic syndrome with unspecified morphologic changes: Secondary | ICD-10-CM | POA: Diagnosis not present

## 2013-09-25 DIAGNOSIS — N186 End stage renal disease: Secondary | ICD-10-CM | POA: Diagnosis not present

## 2013-09-27 DIAGNOSIS — D631 Anemia in chronic kidney disease: Secondary | ICD-10-CM | POA: Diagnosis not present

## 2013-09-27 DIAGNOSIS — N186 End stage renal disease: Secondary | ICD-10-CM | POA: Diagnosis not present

## 2013-09-30 DIAGNOSIS — N186 End stage renal disease: Secondary | ICD-10-CM | POA: Diagnosis not present

## 2013-09-30 DIAGNOSIS — D631 Anemia in chronic kidney disease: Secondary | ICD-10-CM | POA: Diagnosis not present

## 2013-10-01 DIAGNOSIS — G4700 Insomnia, unspecified: Secondary | ICD-10-CM | POA: Diagnosis not present

## 2013-10-01 DIAGNOSIS — N185 Chronic kidney disease, stage 5: Secondary | ICD-10-CM | POA: Diagnosis not present

## 2013-10-01 DIAGNOSIS — N2581 Secondary hyperparathyroidism of renal origin: Secondary | ICD-10-CM | POA: Diagnosis not present

## 2013-10-01 DIAGNOSIS — C880 Waldenstrom macroglobulinemia: Secondary | ICD-10-CM | POA: Diagnosis not present

## 2013-10-03 DIAGNOSIS — D631 Anemia in chronic kidney disease: Secondary | ICD-10-CM | POA: Diagnosis not present

## 2013-10-03 DIAGNOSIS — N186 End stage renal disease: Secondary | ICD-10-CM | POA: Diagnosis not present

## 2013-10-05 DIAGNOSIS — N039 Chronic nephritic syndrome with unspecified morphologic changes: Secondary | ICD-10-CM | POA: Diagnosis not present

## 2013-10-05 DIAGNOSIS — D631 Anemia in chronic kidney disease: Secondary | ICD-10-CM | POA: Diagnosis not present

## 2013-10-05 DIAGNOSIS — N186 End stage renal disease: Secondary | ICD-10-CM | POA: Diagnosis not present

## 2013-10-08 DIAGNOSIS — D631 Anemia in chronic kidney disease: Secondary | ICD-10-CM | POA: Diagnosis not present

## 2013-10-08 DIAGNOSIS — N186 End stage renal disease: Secondary | ICD-10-CM | POA: Diagnosis not present

## 2013-10-08 DIAGNOSIS — N039 Chronic nephritic syndrome with unspecified morphologic changes: Secondary | ICD-10-CM | POA: Diagnosis not present

## 2013-10-10 DIAGNOSIS — N186 End stage renal disease: Secondary | ICD-10-CM | POA: Diagnosis not present

## 2013-10-10 DIAGNOSIS — D631 Anemia in chronic kidney disease: Secondary | ICD-10-CM | POA: Diagnosis not present

## 2013-10-12 DIAGNOSIS — N186 End stage renal disease: Secondary | ICD-10-CM | POA: Diagnosis not present

## 2013-10-12 DIAGNOSIS — D631 Anemia in chronic kidney disease: Secondary | ICD-10-CM | POA: Diagnosis not present

## 2013-10-13 DIAGNOSIS — N186 End stage renal disease: Secondary | ICD-10-CM | POA: Diagnosis not present

## 2013-10-15 DIAGNOSIS — R0602 Shortness of breath: Secondary | ICD-10-CM | POA: Diagnosis not present

## 2013-10-15 DIAGNOSIS — N186 End stage renal disease: Secondary | ICD-10-CM | POA: Diagnosis not present

## 2013-10-15 DIAGNOSIS — R609 Edema, unspecified: Secondary | ICD-10-CM | POA: Diagnosis not present

## 2013-10-15 DIAGNOSIS — J45909 Unspecified asthma, uncomplicated: Secondary | ICD-10-CM | POA: Diagnosis not present

## 2013-10-15 DIAGNOSIS — D631 Anemia in chronic kidney disease: Secondary | ICD-10-CM | POA: Diagnosis not present

## 2013-10-15 DIAGNOSIS — I10 Essential (primary) hypertension: Secondary | ICD-10-CM | POA: Diagnosis not present

## 2013-10-15 DIAGNOSIS — N039 Chronic nephritic syndrome with unspecified morphologic changes: Secondary | ICD-10-CM | POA: Diagnosis not present

## 2013-10-17 DIAGNOSIS — N186 End stage renal disease: Secondary | ICD-10-CM | POA: Diagnosis not present

## 2013-10-17 DIAGNOSIS — D631 Anemia in chronic kidney disease: Secondary | ICD-10-CM | POA: Diagnosis not present

## 2013-10-19 DIAGNOSIS — N186 End stage renal disease: Secondary | ICD-10-CM | POA: Diagnosis not present

## 2013-10-19 DIAGNOSIS — D631 Anemia in chronic kidney disease: Secondary | ICD-10-CM | POA: Diagnosis not present

## 2013-10-22 DIAGNOSIS — N186 End stage renal disease: Secondary | ICD-10-CM | POA: Diagnosis not present

## 2013-10-22 DIAGNOSIS — D631 Anemia in chronic kidney disease: Secondary | ICD-10-CM | POA: Diagnosis not present

## 2013-10-24 DIAGNOSIS — N186 End stage renal disease: Secondary | ICD-10-CM | POA: Diagnosis not present

## 2013-10-24 DIAGNOSIS — D631 Anemia in chronic kidney disease: Secondary | ICD-10-CM | POA: Diagnosis not present

## 2013-10-26 DIAGNOSIS — N186 End stage renal disease: Secondary | ICD-10-CM | POA: Diagnosis not present

## 2013-10-26 DIAGNOSIS — D631 Anemia in chronic kidney disease: Secondary | ICD-10-CM | POA: Diagnosis not present

## 2013-10-28 DIAGNOSIS — D631 Anemia in chronic kidney disease: Secondary | ICD-10-CM | POA: Diagnosis not present

## 2013-10-28 DIAGNOSIS — N185 Chronic kidney disease, stage 5: Secondary | ICD-10-CM | POA: Diagnosis not present

## 2013-10-28 DIAGNOSIS — C880 Waldenstrom macroglobulinemia: Secondary | ICD-10-CM | POA: Diagnosis not present

## 2013-10-29 DIAGNOSIS — N186 End stage renal disease: Secondary | ICD-10-CM | POA: Diagnosis not present

## 2013-10-29 DIAGNOSIS — N039 Chronic nephritic syndrome with unspecified morphologic changes: Secondary | ICD-10-CM | POA: Diagnosis not present

## 2013-10-29 DIAGNOSIS — D631 Anemia in chronic kidney disease: Secondary | ICD-10-CM | POA: Diagnosis not present

## 2013-10-31 DIAGNOSIS — N186 End stage renal disease: Secondary | ICD-10-CM | POA: Diagnosis not present

## 2013-10-31 DIAGNOSIS — D631 Anemia in chronic kidney disease: Secondary | ICD-10-CM | POA: Diagnosis not present

## 2013-11-02 DIAGNOSIS — D631 Anemia in chronic kidney disease: Secondary | ICD-10-CM | POA: Diagnosis not present

## 2013-11-02 DIAGNOSIS — N186 End stage renal disease: Secondary | ICD-10-CM | POA: Diagnosis not present

## 2013-11-05 DIAGNOSIS — N039 Chronic nephritic syndrome with unspecified morphologic changes: Secondary | ICD-10-CM | POA: Diagnosis not present

## 2013-11-05 DIAGNOSIS — D631 Anemia in chronic kidney disease: Secondary | ICD-10-CM | POA: Diagnosis not present

## 2013-11-05 DIAGNOSIS — N186 End stage renal disease: Secondary | ICD-10-CM | POA: Diagnosis not present

## 2013-11-07 DIAGNOSIS — D631 Anemia in chronic kidney disease: Secondary | ICD-10-CM | POA: Diagnosis not present

## 2013-11-07 DIAGNOSIS — N186 End stage renal disease: Secondary | ICD-10-CM | POA: Diagnosis not present

## 2013-11-09 DIAGNOSIS — N186 End stage renal disease: Secondary | ICD-10-CM | POA: Diagnosis not present

## 2013-11-09 DIAGNOSIS — D631 Anemia in chronic kidney disease: Secondary | ICD-10-CM | POA: Diagnosis not present

## 2013-11-09 DIAGNOSIS — N039 Chronic nephritic syndrome with unspecified morphologic changes: Secondary | ICD-10-CM | POA: Diagnosis not present

## 2013-11-10 DIAGNOSIS — N186 End stage renal disease: Secondary | ICD-10-CM | POA: Diagnosis not present

## 2013-11-12 DIAGNOSIS — D509 Iron deficiency anemia, unspecified: Secondary | ICD-10-CM | POA: Diagnosis not present

## 2013-11-12 DIAGNOSIS — N186 End stage renal disease: Secondary | ICD-10-CM | POA: Diagnosis not present

## 2013-11-12 DIAGNOSIS — D631 Anemia in chronic kidney disease: Secondary | ICD-10-CM | POA: Diagnosis not present

## 2013-11-19 DIAGNOSIS — N186 End stage renal disease: Secondary | ICD-10-CM | POA: Diagnosis not present

## 2013-11-19 DIAGNOSIS — Z23 Encounter for immunization: Secondary | ICD-10-CM | POA: Diagnosis not present

## 2013-11-19 DIAGNOSIS — D631 Anemia in chronic kidney disease: Secondary | ICD-10-CM | POA: Diagnosis not present

## 2013-11-21 DIAGNOSIS — D631 Anemia in chronic kidney disease: Secondary | ICD-10-CM | POA: Diagnosis not present

## 2013-11-21 DIAGNOSIS — N039 Chronic nephritic syndrome with unspecified morphologic changes: Secondary | ICD-10-CM | POA: Diagnosis not present

## 2013-11-21 DIAGNOSIS — Z23 Encounter for immunization: Secondary | ICD-10-CM | POA: Diagnosis not present

## 2013-11-21 DIAGNOSIS — N186 End stage renal disease: Secondary | ICD-10-CM | POA: Diagnosis not present

## 2013-11-23 DIAGNOSIS — Z23 Encounter for immunization: Secondary | ICD-10-CM | POA: Diagnosis not present

## 2013-11-23 DIAGNOSIS — D631 Anemia in chronic kidney disease: Secondary | ICD-10-CM | POA: Diagnosis not present

## 2013-11-23 DIAGNOSIS — N186 End stage renal disease: Secondary | ICD-10-CM | POA: Diagnosis not present

## 2013-11-26 DIAGNOSIS — N186 End stage renal disease: Secondary | ICD-10-CM | POA: Diagnosis not present

## 2013-11-26 DIAGNOSIS — Z23 Encounter for immunization: Secondary | ICD-10-CM | POA: Diagnosis not present

## 2013-11-26 DIAGNOSIS — D631 Anemia in chronic kidney disease: Secondary | ICD-10-CM | POA: Diagnosis not present

## 2013-11-26 DIAGNOSIS — N039 Chronic nephritic syndrome with unspecified morphologic changes: Secondary | ICD-10-CM | POA: Diagnosis not present

## 2013-11-28 DIAGNOSIS — D631 Anemia in chronic kidney disease: Secondary | ICD-10-CM | POA: Diagnosis not present

## 2013-11-28 DIAGNOSIS — N186 End stage renal disease: Secondary | ICD-10-CM | POA: Diagnosis not present

## 2013-11-28 DIAGNOSIS — Z23 Encounter for immunization: Secondary | ICD-10-CM | POA: Diagnosis not present

## 2013-11-30 DIAGNOSIS — N186 End stage renal disease: Secondary | ICD-10-CM | POA: Diagnosis not present

## 2013-11-30 DIAGNOSIS — D631 Anemia in chronic kidney disease: Secondary | ICD-10-CM | POA: Diagnosis not present

## 2013-11-30 DIAGNOSIS — N039 Chronic nephritic syndrome with unspecified morphologic changes: Secondary | ICD-10-CM | POA: Diagnosis not present

## 2013-11-30 DIAGNOSIS — Z23 Encounter for immunization: Secondary | ICD-10-CM | POA: Diagnosis not present

## 2013-12-03 DIAGNOSIS — Z23 Encounter for immunization: Secondary | ICD-10-CM | POA: Diagnosis not present

## 2013-12-03 DIAGNOSIS — D631 Anemia in chronic kidney disease: Secondary | ICD-10-CM | POA: Diagnosis not present

## 2013-12-03 DIAGNOSIS — N186 End stage renal disease: Secondary | ICD-10-CM | POA: Diagnosis not present

## 2013-12-05 DIAGNOSIS — N186 End stage renal disease: Secondary | ICD-10-CM | POA: Diagnosis not present

## 2013-12-05 DIAGNOSIS — D631 Anemia in chronic kidney disease: Secondary | ICD-10-CM | POA: Diagnosis not present

## 2013-12-05 DIAGNOSIS — Z23 Encounter for immunization: Secondary | ICD-10-CM | POA: Diagnosis not present

## 2013-12-07 DIAGNOSIS — D631 Anemia in chronic kidney disease: Secondary | ICD-10-CM | POA: Diagnosis not present

## 2013-12-07 DIAGNOSIS — Z23 Encounter for immunization: Secondary | ICD-10-CM | POA: Diagnosis not present

## 2013-12-07 DIAGNOSIS — N186 End stage renal disease: Secondary | ICD-10-CM | POA: Diagnosis not present

## 2013-12-10 DIAGNOSIS — N186 End stage renal disease: Secondary | ICD-10-CM | POA: Diagnosis not present

## 2013-12-10 DIAGNOSIS — Z23 Encounter for immunization: Secondary | ICD-10-CM | POA: Diagnosis not present

## 2013-12-10 DIAGNOSIS — D631 Anemia in chronic kidney disease: Secondary | ICD-10-CM | POA: Diagnosis not present

## 2013-12-11 DIAGNOSIS — N186 End stage renal disease: Secondary | ICD-10-CM | POA: Diagnosis not present

## 2013-12-12 DIAGNOSIS — R7989 Other specified abnormal findings of blood chemistry: Secondary | ICD-10-CM | POA: Diagnosis not present

## 2013-12-12 DIAGNOSIS — D539 Nutritional anemia, unspecified: Secondary | ICD-10-CM | POA: Diagnosis not present

## 2013-12-12 DIAGNOSIS — B199 Unspecified viral hepatitis without hepatic coma: Secondary | ICD-10-CM | POA: Diagnosis not present

## 2013-12-12 DIAGNOSIS — N186 End stage renal disease: Secondary | ICD-10-CM | POA: Diagnosis not present

## 2013-12-12 DIAGNOSIS — D631 Anemia in chronic kidney disease: Secondary | ICD-10-CM | POA: Diagnosis not present

## 2013-12-12 DIAGNOSIS — E215 Disorder of parathyroid gland, unspecified: Secondary | ICD-10-CM | POA: Diagnosis not present

## 2013-12-12 DIAGNOSIS — N039 Chronic nephritic syndrome with unspecified morphologic changes: Secondary | ICD-10-CM | POA: Diagnosis not present

## 2013-12-14 DIAGNOSIS — D631 Anemia in chronic kidney disease: Secondary | ICD-10-CM | POA: Diagnosis not present

## 2013-12-14 DIAGNOSIS — N186 End stage renal disease: Secondary | ICD-10-CM | POA: Diagnosis not present

## 2013-12-14 DIAGNOSIS — N039 Chronic nephritic syndrome with unspecified morphologic changes: Secondary | ICD-10-CM | POA: Diagnosis not present

## 2013-12-17 DIAGNOSIS — D631 Anemia in chronic kidney disease: Secondary | ICD-10-CM | POA: Diagnosis not present

## 2013-12-17 DIAGNOSIS — N186 End stage renal disease: Secondary | ICD-10-CM | POA: Diagnosis not present

## 2013-12-19 DIAGNOSIS — D631 Anemia in chronic kidney disease: Secondary | ICD-10-CM | POA: Diagnosis not present

## 2013-12-19 DIAGNOSIS — N186 End stage renal disease: Secondary | ICD-10-CM | POA: Diagnosis not present

## 2013-12-21 DIAGNOSIS — N186 End stage renal disease: Secondary | ICD-10-CM | POA: Diagnosis not present

## 2013-12-21 DIAGNOSIS — N039 Chronic nephritic syndrome with unspecified morphologic changes: Secondary | ICD-10-CM | POA: Diagnosis not present

## 2013-12-21 DIAGNOSIS — D631 Anemia in chronic kidney disease: Secondary | ICD-10-CM | POA: Diagnosis not present

## 2013-12-24 DIAGNOSIS — D631 Anemia in chronic kidney disease: Secondary | ICD-10-CM | POA: Diagnosis not present

## 2013-12-24 DIAGNOSIS — N186 End stage renal disease: Secondary | ICD-10-CM | POA: Diagnosis not present

## 2013-12-26 DIAGNOSIS — N039 Chronic nephritic syndrome with unspecified morphologic changes: Secondary | ICD-10-CM | POA: Diagnosis not present

## 2013-12-26 DIAGNOSIS — N186 End stage renal disease: Secondary | ICD-10-CM | POA: Diagnosis not present

## 2013-12-26 DIAGNOSIS — D631 Anemia in chronic kidney disease: Secondary | ICD-10-CM | POA: Diagnosis not present

## 2013-12-28 DIAGNOSIS — N186 End stage renal disease: Secondary | ICD-10-CM | POA: Diagnosis not present

## 2013-12-28 DIAGNOSIS — D631 Anemia in chronic kidney disease: Secondary | ICD-10-CM | POA: Diagnosis not present

## 2013-12-30 DIAGNOSIS — D509 Iron deficiency anemia, unspecified: Secondary | ICD-10-CM | POA: Diagnosis not present

## 2013-12-30 DIAGNOSIS — I10 Essential (primary) hypertension: Secondary | ICD-10-CM | POA: Diagnosis not present

## 2013-12-30 DIAGNOSIS — J029 Acute pharyngitis, unspecified: Secondary | ICD-10-CM | POA: Diagnosis not present

## 2013-12-30 DIAGNOSIS — J45909 Unspecified asthma, uncomplicated: Secondary | ICD-10-CM | POA: Diagnosis not present

## 2013-12-31 DIAGNOSIS — N186 End stage renal disease: Secondary | ICD-10-CM | POA: Diagnosis not present

## 2013-12-31 DIAGNOSIS — D631 Anemia in chronic kidney disease: Secondary | ICD-10-CM | POA: Diagnosis not present

## 2014-01-02 DIAGNOSIS — N186 End stage renal disease: Secondary | ICD-10-CM | POA: Diagnosis not present

## 2014-01-02 DIAGNOSIS — D631 Anemia in chronic kidney disease: Secondary | ICD-10-CM | POA: Diagnosis not present

## 2014-01-07 DIAGNOSIS — D631 Anemia in chronic kidney disease: Secondary | ICD-10-CM | POA: Diagnosis not present

## 2014-01-07 DIAGNOSIS — N186 End stage renal disease: Secondary | ICD-10-CM | POA: Diagnosis not present

## 2014-01-09 DIAGNOSIS — D631 Anemia in chronic kidney disease: Secondary | ICD-10-CM | POA: Diagnosis not present

## 2014-01-09 DIAGNOSIS — N186 End stage renal disease: Secondary | ICD-10-CM | POA: Diagnosis not present

## 2014-01-10 DIAGNOSIS — N186 End stage renal disease: Secondary | ICD-10-CM | POA: Diagnosis not present

## 2014-01-11 DIAGNOSIS — N186 End stage renal disease: Secondary | ICD-10-CM | POA: Diagnosis not present

## 2014-01-11 DIAGNOSIS — R7989 Other specified abnormal findings of blood chemistry: Secondary | ICD-10-CM | POA: Diagnosis not present

## 2014-01-11 DIAGNOSIS — B199 Unspecified viral hepatitis without hepatic coma: Secondary | ICD-10-CM | POA: Diagnosis not present

## 2014-01-11 DIAGNOSIS — D539 Nutritional anemia, unspecified: Secondary | ICD-10-CM | POA: Diagnosis not present

## 2014-01-11 DIAGNOSIS — E215 Disorder of parathyroid gland, unspecified: Secondary | ICD-10-CM | POA: Diagnosis not present

## 2014-01-11 DIAGNOSIS — D631 Anemia in chronic kidney disease: Secondary | ICD-10-CM | POA: Diagnosis not present

## 2014-01-11 DIAGNOSIS — N039 Chronic nephritic syndrome with unspecified morphologic changes: Secondary | ICD-10-CM | POA: Diagnosis not present

## 2014-01-14 DIAGNOSIS — N186 End stage renal disease: Secondary | ICD-10-CM | POA: Diagnosis not present

## 2014-01-16 DIAGNOSIS — N186 End stage renal disease: Secondary | ICD-10-CM | POA: Diagnosis not present

## 2014-01-18 DIAGNOSIS — N186 End stage renal disease: Secondary | ICD-10-CM | POA: Diagnosis not present

## 2014-01-21 DIAGNOSIS — N186 End stage renal disease: Secondary | ICD-10-CM | POA: Diagnosis not present

## 2014-01-23 DIAGNOSIS — N186 End stage renal disease: Secondary | ICD-10-CM | POA: Diagnosis not present

## 2014-01-25 DIAGNOSIS — N186 End stage renal disease: Secondary | ICD-10-CM | POA: Diagnosis not present

## 2014-01-27 DIAGNOSIS — N185 Chronic kidney disease, stage 5: Secondary | ICD-10-CM | POA: Diagnosis not present

## 2014-01-27 DIAGNOSIS — D631 Anemia in chronic kidney disease: Secondary | ICD-10-CM | POA: Diagnosis not present

## 2014-01-27 DIAGNOSIS — C880 Waldenstrom macroglobulinemia: Secondary | ICD-10-CM | POA: Diagnosis not present

## 2014-01-28 DIAGNOSIS — N186 End stage renal disease: Secondary | ICD-10-CM | POA: Diagnosis not present

## 2014-01-28 DIAGNOSIS — N039 Chronic nephritic syndrome with unspecified morphologic changes: Secondary | ICD-10-CM | POA: Diagnosis not present

## 2014-01-28 DIAGNOSIS — D631 Anemia in chronic kidney disease: Secondary | ICD-10-CM | POA: Diagnosis not present

## 2014-01-30 DIAGNOSIS — D631 Anemia in chronic kidney disease: Secondary | ICD-10-CM | POA: Diagnosis not present

## 2014-01-30 DIAGNOSIS — N186 End stage renal disease: Secondary | ICD-10-CM | POA: Diagnosis not present

## 2014-02-01 DIAGNOSIS — D631 Anemia in chronic kidney disease: Secondary | ICD-10-CM | POA: Diagnosis not present

## 2014-02-01 DIAGNOSIS — N186 End stage renal disease: Secondary | ICD-10-CM | POA: Diagnosis not present

## 2014-02-04 DIAGNOSIS — D631 Anemia in chronic kidney disease: Secondary | ICD-10-CM | POA: Diagnosis not present

## 2014-02-04 DIAGNOSIS — N186 End stage renal disease: Secondary | ICD-10-CM | POA: Diagnosis not present

## 2014-02-06 DIAGNOSIS — D631 Anemia in chronic kidney disease: Secondary | ICD-10-CM | POA: Diagnosis not present

## 2014-02-06 DIAGNOSIS — N186 End stage renal disease: Secondary | ICD-10-CM | POA: Diagnosis not present

## 2014-02-08 DIAGNOSIS — N186 End stage renal disease: Secondary | ICD-10-CM | POA: Diagnosis not present

## 2014-02-08 DIAGNOSIS — D631 Anemia in chronic kidney disease: Secondary | ICD-10-CM | POA: Diagnosis not present

## 2014-02-08 DIAGNOSIS — N039 Chronic nephritic syndrome with unspecified morphologic changes: Secondary | ICD-10-CM | POA: Diagnosis not present

## 2014-02-10 DIAGNOSIS — N186 End stage renal disease: Secondary | ICD-10-CM | POA: Diagnosis not present

## 2014-02-11 DIAGNOSIS — N186 End stage renal disease: Secondary | ICD-10-CM | POA: Diagnosis not present

## 2014-02-11 DIAGNOSIS — N039 Chronic nephritic syndrome with unspecified morphologic changes: Secondary | ICD-10-CM | POA: Diagnosis not present

## 2014-02-11 DIAGNOSIS — D631 Anemia in chronic kidney disease: Secondary | ICD-10-CM | POA: Diagnosis not present

## 2014-02-13 DIAGNOSIS — E215 Disorder of parathyroid gland, unspecified: Secondary | ICD-10-CM | POA: Diagnosis not present

## 2014-02-13 DIAGNOSIS — B199 Unspecified viral hepatitis without hepatic coma: Secondary | ICD-10-CM | POA: Diagnosis not present

## 2014-02-13 DIAGNOSIS — D539 Nutritional anemia, unspecified: Secondary | ICD-10-CM | POA: Diagnosis not present

## 2014-02-13 DIAGNOSIS — N186 End stage renal disease: Secondary | ICD-10-CM | POA: Diagnosis not present

## 2014-02-13 DIAGNOSIS — D631 Anemia in chronic kidney disease: Secondary | ICD-10-CM | POA: Diagnosis not present

## 2014-02-13 DIAGNOSIS — R7989 Other specified abnormal findings of blood chemistry: Secondary | ICD-10-CM | POA: Diagnosis not present

## 2014-02-15 DIAGNOSIS — N186 End stage renal disease: Secondary | ICD-10-CM | POA: Diagnosis not present

## 2014-02-15 DIAGNOSIS — D631 Anemia in chronic kidney disease: Secondary | ICD-10-CM | POA: Diagnosis not present

## 2014-02-18 DIAGNOSIS — D631 Anemia in chronic kidney disease: Secondary | ICD-10-CM | POA: Diagnosis not present

## 2014-02-18 DIAGNOSIS — N186 End stage renal disease: Secondary | ICD-10-CM | POA: Diagnosis not present

## 2014-02-20 DIAGNOSIS — D631 Anemia in chronic kidney disease: Secondary | ICD-10-CM | POA: Diagnosis not present

## 2014-02-20 DIAGNOSIS — N186 End stage renal disease: Secondary | ICD-10-CM | POA: Diagnosis not present

## 2014-02-22 DIAGNOSIS — D509 Iron deficiency anemia, unspecified: Secondary | ICD-10-CM | POA: Diagnosis not present

## 2014-02-22 DIAGNOSIS — C880 Waldenstrom macroglobulinemia: Secondary | ICD-10-CM | POA: Diagnosis not present

## 2014-02-22 DIAGNOSIS — J45909 Unspecified asthma, uncomplicated: Secondary | ICD-10-CM | POA: Diagnosis not present

## 2014-02-22 DIAGNOSIS — D631 Anemia in chronic kidney disease: Secondary | ICD-10-CM | POA: Diagnosis not present

## 2014-02-22 DIAGNOSIS — R609 Edema, unspecified: Secondary | ICD-10-CM | POA: Diagnosis not present

## 2014-02-22 DIAGNOSIS — N186 End stage renal disease: Secondary | ICD-10-CM | POA: Diagnosis not present

## 2014-02-25 DIAGNOSIS — D509 Iron deficiency anemia, unspecified: Secondary | ICD-10-CM | POA: Diagnosis not present

## 2014-02-25 DIAGNOSIS — N186 End stage renal disease: Secondary | ICD-10-CM | POA: Diagnosis not present

## 2014-02-25 DIAGNOSIS — N039 Chronic nephritic syndrome with unspecified morphologic changes: Secondary | ICD-10-CM | POA: Diagnosis not present

## 2014-02-25 DIAGNOSIS — D631 Anemia in chronic kidney disease: Secondary | ICD-10-CM | POA: Diagnosis not present

## 2014-02-27 DIAGNOSIS — D509 Iron deficiency anemia, unspecified: Secondary | ICD-10-CM | POA: Diagnosis not present

## 2014-02-27 DIAGNOSIS — N186 End stage renal disease: Secondary | ICD-10-CM | POA: Diagnosis not present

## 2014-02-27 DIAGNOSIS — D631 Anemia in chronic kidney disease: Secondary | ICD-10-CM | POA: Diagnosis not present

## 2014-03-01 DIAGNOSIS — D631 Anemia in chronic kidney disease: Secondary | ICD-10-CM | POA: Diagnosis not present

## 2014-03-01 DIAGNOSIS — D509 Iron deficiency anemia, unspecified: Secondary | ICD-10-CM | POA: Diagnosis not present

## 2014-03-01 DIAGNOSIS — N186 End stage renal disease: Secondary | ICD-10-CM | POA: Diagnosis not present

## 2014-03-04 DIAGNOSIS — N186 End stage renal disease: Secondary | ICD-10-CM | POA: Diagnosis not present

## 2014-03-04 DIAGNOSIS — D509 Iron deficiency anemia, unspecified: Secondary | ICD-10-CM | POA: Diagnosis not present

## 2014-03-04 DIAGNOSIS — D631 Anemia in chronic kidney disease: Secondary | ICD-10-CM | POA: Diagnosis not present

## 2014-03-06 DIAGNOSIS — D509 Iron deficiency anemia, unspecified: Secondary | ICD-10-CM | POA: Diagnosis not present

## 2014-03-06 DIAGNOSIS — D631 Anemia in chronic kidney disease: Secondary | ICD-10-CM | POA: Diagnosis not present

## 2014-03-06 DIAGNOSIS — N186 End stage renal disease: Secondary | ICD-10-CM | POA: Diagnosis not present

## 2014-03-08 DIAGNOSIS — D509 Iron deficiency anemia, unspecified: Secondary | ICD-10-CM | POA: Diagnosis not present

## 2014-03-08 DIAGNOSIS — N186 End stage renal disease: Secondary | ICD-10-CM | POA: Diagnosis not present

## 2014-03-08 DIAGNOSIS — D631 Anemia in chronic kidney disease: Secondary | ICD-10-CM | POA: Diagnosis not present

## 2014-03-11 DIAGNOSIS — N186 End stage renal disease: Secondary | ICD-10-CM | POA: Diagnosis not present

## 2014-03-11 DIAGNOSIS — D509 Iron deficiency anemia, unspecified: Secondary | ICD-10-CM | POA: Diagnosis not present

## 2014-03-11 DIAGNOSIS — D631 Anemia in chronic kidney disease: Secondary | ICD-10-CM | POA: Diagnosis not present

## 2014-03-13 DIAGNOSIS — D631 Anemia in chronic kidney disease: Secondary | ICD-10-CM | POA: Diagnosis not present

## 2014-03-13 DIAGNOSIS — D509 Iron deficiency anemia, unspecified: Secondary | ICD-10-CM | POA: Diagnosis not present

## 2014-03-13 DIAGNOSIS — N186 End stage renal disease: Secondary | ICD-10-CM | POA: Diagnosis not present

## 2014-04-11 DIAGNOSIS — N186 End stage renal disease: Secondary | ICD-10-CM | POA: Diagnosis not present

## 2014-04-12 DIAGNOSIS — D509 Iron deficiency anemia, unspecified: Secondary | ICD-10-CM | POA: Diagnosis not present

## 2014-04-12 DIAGNOSIS — N186 End stage renal disease: Secondary | ICD-10-CM | POA: Diagnosis not present

## 2014-04-14 ENCOUNTER — Telehealth: Payer: Self-pay | Admitting: Hematology & Oncology

## 2014-04-14 NOTE — Telephone Encounter (Signed)
Pt called made 8-5 2 pm lab and 4 pm MD appointments, MD aware

## 2014-04-16 ENCOUNTER — Encounter: Payer: Self-pay | Admitting: Hematology & Oncology

## 2014-04-16 ENCOUNTER — Ambulatory Visit (HOSPITAL_BASED_OUTPATIENT_CLINIC_OR_DEPARTMENT_OTHER): Payer: Medicare Other | Admitting: Hematology & Oncology

## 2014-04-16 ENCOUNTER — Ambulatory Visit (HOSPITAL_BASED_OUTPATIENT_CLINIC_OR_DEPARTMENT_OTHER): Payer: Medicare Other | Admitting: Lab

## 2014-04-16 VITALS — BP 134/73 | HR 94 | Temp 98.5°F | Resp 14 | Ht 67.0 in | Wt 145.0 lb

## 2014-04-16 DIAGNOSIS — D631 Anemia in chronic kidney disease: Secondary | ICD-10-CM

## 2014-04-16 DIAGNOSIS — C8800 Waldenstrom macroglobulinemia not having achieved remission: Secondary | ICD-10-CM

## 2014-04-16 DIAGNOSIS — N039 Chronic nephritic syndrome with unspecified morphologic changes: Secondary | ICD-10-CM | POA: Diagnosis not present

## 2014-04-16 DIAGNOSIS — L03319 Cellulitis of trunk, unspecified: Secondary | ICD-10-CM | POA: Diagnosis not present

## 2014-04-16 DIAGNOSIS — N189 Chronic kidney disease, unspecified: Secondary | ICD-10-CM

## 2014-04-16 DIAGNOSIS — L02219 Cutaneous abscess of trunk, unspecified: Secondary | ICD-10-CM

## 2014-04-16 DIAGNOSIS — N19 Unspecified kidney failure: Secondary | ICD-10-CM

## 2014-04-16 DIAGNOSIS — D649 Anemia, unspecified: Secondary | ICD-10-CM | POA: Diagnosis not present

## 2014-04-16 DIAGNOSIS — Z992 Dependence on renal dialysis: Secondary | ICD-10-CM

## 2014-04-16 LAB — CBC WITH DIFFERENTIAL (CANCER CENTER ONLY)
BASO#: 0 10*3/uL (ref 0.0–0.2)
BASO%: 0.5 % (ref 0.0–2.0)
EOS ABS: 0.1 10*3/uL (ref 0.0–0.5)
EOS%: 1.4 % (ref 0.0–7.0)
HCT: 36.2 % (ref 34.8–46.6)
HEMOGLOBIN: 12.1 g/dL (ref 11.6–15.9)
LYMPH#: 2.2 10*3/uL (ref 0.9–3.3)
LYMPH%: 25.2 % (ref 14.0–48.0)
MCH: 30.7 pg (ref 26.0–34.0)
MCHC: 33.4 g/dL (ref 32.0–36.0)
MCV: 92 fL (ref 81–101)
MONO#: 0.6 10*3/uL (ref 0.1–0.9)
MONO%: 7.5 % (ref 0.0–13.0)
NEUT%: 65.4 % (ref 39.6–80.0)
NEUTROS ABS: 5.6 10*3/uL (ref 1.5–6.5)
PLATELETS: 352 10*3/uL (ref 145–400)
RBC: 3.94 10*6/uL (ref 3.70–5.32)
RDW: 14.6 % (ref 11.1–15.7)
WBC: 8.6 10*3/uL (ref 3.9–10.0)

## 2014-04-16 LAB — CHCC SATELLITE - SMEAR

## 2014-04-16 NOTE — Progress Notes (Signed)
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 feeling well  : I have moved back into town.  HPI: Ms.Ligon is well-known does. If she is a 68 year old African American female. We have not seen her for a couple years at least. We have been following her along. She's had to move back up to [place].  While up in [place], she had diverticulitis. She required emergency surgery. She had a lot of complications. She was in the ICU and was hospitalized for 3 months. She is on hemodialysis now.   She has been followed by a cardiologist up there, Dr.Horkheimer. She really liked him.  She was to with chemotherapy. She received Velcade and Rituxan. She was treated in from September 2013 through August 2014. She has responded. Her last IgM level was 1336 mg/dL. Her M spike is 0.81 g/dL. Her lambda light chain was 132.7 mg/L.  She came back down to [place] to get back into her home.  She feels we will. She gets dialysis 3 days a week.  She has an abscess on her anterior abdominal wall. This needs to be drained surgically. I'll have to make an appointment with surgery.  She's had no fever. She's had no problems with her bowels. She's had no leg swelling. She's had no change in medications.  Overall, her performance status is ECOG 1            Past Medical History  Diagnosis Date  . 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 Renal failure 09/23/2011  . Diverticulitis of large intestine 09/23/2011  . Abnormal Pap smear     ASCUS   . Cancer     malig lymphoplasmacytic lymphoma  . Breast calcification seen on mammogram 2010  . Pain, pelvic, female 2009  :  Past Surgical History  Procedure Laterality Date  . Total abdominal hysterectomy    . Tonsillectomy    . Wisdom tooth extraction     :  Current outpatient prescriptions:acyclovir (ZOVIRAX) 200 MG capsule, Take 200 mg by mouth 2 (two) times daily. , Disp: , Rfl: ;  B Complex-C-Folic Acid (RENAL MULTIVITAMIN FORMULA PO), Take by mouth., Disp: , Rfl: ;  Cetirizine HCl (ZYRTEC ALLERGY PO), Take by mouth every morning., Disp: , Rfl: ;  DULERA 200-5 MCG/ACT AERO, Inhale 2 puffs into the lungs 2 (two) times daily. , Disp: , Rfl:  RENVELA 800 MG tablet, Take by mouth 3 (three) times daily with meals. , Disp: , Rfl: :  :  Allergies  Allergen Reactions  . Ciprofloxacin Itching  . Diphenhydramine Hcl Itching  :  Family History  Problem Relation Age of Onset  . Cancer Mother   . Cancer Father   . Learning disabilities Father   . Cancer Brother   . Learning disabilities Brother   . Hypertension Brother   :  History   Social History  . Marital Status: Single    Spouse Name: N/A    Number of Children: N/A  . Years  of Education: N/A   Occupational History  . Not on file.   Social History Main Topics  . Smoking status: Never Smoker   . Smokeless tobacco: Never Used  . Alcohol Use: No  . Drug Use: No  . Sexual Activity: No     Comment: Hyst   Other Topics Concern  . Not on file   Social History Narrative  . No narrative on file  :  Pertinent items are noted in HPI.  Exam: @IPVITALS @  is a thin Afro-American female in no obvious distress. Her vital signs show a temperature of 98.5. Pulse 94. Blood pressure 1 134/73. weight is 145 pounds.. Head and neck exam shows No ocular or oral lesions. There are no palpable cervical or supraclavicular lymph nodes. Lungs are clear. Cardiac exam regular in rhythm. She is a 1/6 systolic ejection murmur. Abdomen soft. She has an abscess in the left lower quadrant. This brought measures about 3 x 3 cm. It is somewhat fluctuant. It is tender. There is no palpable liver tip or spleen tip. Back exam no tenderness over the spine. Extremities shows no clubbing cyanosis or edema.  Neurological exam is nonfocal.        Recent Labs  04/16/14 1414  WBC 8.6  HGB 12.1  HCT 36.2  PLT 352   No results found for this basename: NA, K, CL, CO2, GLUCOSE, BUN, CREATININE, CALCIUM,  in the last 72 hours  Blood smear review: Normochromic and normocytic red blood cells. There are no nucleated cells. There is no rouleau formation. I see no target cells. White cells are normal in morphology maturation. There are no nucleated red blood cells. There are no hypersegmented polys. There is no atypical lymphocytes. Platelets are adequate in number and size.  Pathology: None     Assessment and Plan: 68 year old [place] American female. She has Waldenstrm's macroglobulinemia. She now is back here. We really cannot treat her much when we saw her initially. She got to [place] and required therapy.  We will see what her studies show.  Am more worried about this abscess in the left lower quadrant. This really needs to be opened up. I'll see about given her to surgery.  Of course, she's gone back down on Friday. She go back up to [place]. She will be back until Monday. She then has d dialysis  on Tuesday.  I don't know if surgery can see her tomorrow.  Not, then we'll have to get her in next week. She's had this for several days so that they would probably could wait another few days of necessary.  I spent a good 45 minutes with her. It was nice to see her again.

## 2014-04-17 ENCOUNTER — Ambulatory Visit (INDEPENDENT_AMBULATORY_CARE_PROVIDER_SITE_OTHER): Payer: Medicare Other | Admitting: General Surgery

## 2014-04-17 ENCOUNTER — Encounter: Payer: Self-pay | Admitting: Nurse Practitioner

## 2014-04-17 ENCOUNTER — Telehealth: Payer: Self-pay | Admitting: Hematology & Oncology

## 2014-04-17 ENCOUNTER — Encounter (INDEPENDENT_AMBULATORY_CARE_PROVIDER_SITE_OTHER): Payer: Self-pay | Admitting: General Surgery

## 2014-04-17 VITALS — BP 126/72 | HR 89 | Temp 98.0°F | Resp 18 | Ht 67.0 in | Wt 146.0 lb

## 2014-04-17 DIAGNOSIS — L03319 Cellulitis of trunk, unspecified: Secondary | ICD-10-CM

## 2014-04-17 DIAGNOSIS — L02211 Cutaneous abscess of abdominal wall: Secondary | ICD-10-CM | POA: Insufficient documentation

## 2014-04-17 DIAGNOSIS — L02219 Cutaneous abscess of trunk, unspecified: Secondary | ICD-10-CM | POA: Diagnosis not present

## 2014-04-17 LAB — IRON AND TIBC CHCC
%SAT: 13 % — AB (ref 21–57)
IRON: 31 ug/dL — AB (ref 41–142)
TIBC: 234 ug/dL — ABNORMAL LOW (ref 236–444)
UIBC: 203 ug/dL (ref 120–384)

## 2014-04-17 LAB — FERRITIN CHCC: FERRITIN: 598 ng/mL — AB (ref 9–269)

## 2014-04-17 MED ORDER — DOXYCYCLINE HYCLATE 100 MG PO TABS: 100.0000 mg | ORAL_TABLET | Freq: Two times a day (BID) | ORAL | Status: DC

## 2014-04-17 NOTE — Progress Notes (Signed)
Patient ID: Shannon Murray, female   DOB: 1946/04/23, 68 y.o.   MRN: [id number]  Chief Complaint  Patient presents with  . Follow-up    abd. abscess    HPI Shannon Murray is a 68 y.o. female.   HPI  She is referred by Dr. Marin Olp because of a left lower quadrant abdominal wall abscess.  She noted swelling that began at a previous colostomy site which progressively worsened. She saw Dr. Marin Olp yesterday and a needle aspiration was performed and purulent material evacuated. Her since that time, the area has been spontaneously draining purulent fluid. No fever or chills. She had hemodialysis today.  Past Medical History  Diagnosis Date  . 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 Renal failure 09/23/2011  . Diverticulitis of large intestine 09/23/2011  . Abnormal Pap smear     ASCUS   . Cancer     malig lymphoplasmacytic lymphoma  . Breast calcification seen on mammogram 2010  . Pain, pelvic, female 2009    Past Surgical History  Procedure Laterality Date  . Total abdominal hysterectomy    . Tonsillectomy    . Wisdom tooth extraction      Family History  Problem Relation Age of Onset  . Cancer Mother   . Cancer Father   . Learning disabilities Father   . Cancer Brother   . Learning disabilities Brother   . Hypertension Brother     Social History History  Substance Use Topics  . Smoking status: Never Smoker   . Smokeless tobacco: Never Used  . Alcohol Use: No    Allergies  Allergen Reactions  . Ciprofloxacin Itching  . Diphenhydramine Hcl Itching    Current Outpatient Prescriptions  Medication Sig Dispense Refill  . acyclovir (ZOVIRAX) 200 MG capsule Take 200 mg by mouth 2 (two) times daily.       . B Complex-C-Folic Acid (RENAL MULTIVITAMIN FORMULA PO) Take by mouth.      . Cetirizine HCl (ZYRTEC ALLERGY PO) Take by mouth every morning.      . DULERA 200-5 MCG/ACT AERO Inhale 2 puffs into the lungs 2 (two) times daily.       Marland Kitchen RENVELA 800 MG tablet Take  by mouth 3 (three) times daily with meals.       Marland Kitchen doxycycline (VIBRA-TABS) 100 MG tablet Take 1 tablet (100 mg total) by mouth 2 (two) times daily.  20 tablet  0   No current facility-administered medications for this visit.    Review of Systems Review of Systems  Constitutional: Negative.   Gastrointestinal: Positive for abdominal pain (around the abscess site).    Blood pressure 126/72, pulse 89, temperature 98 F (36.7 C), resp. rate 18, height 5\' 7"  (1.702 m), weight 146 lb (66.225 kg).  Physical Exam Physical Exam  Constitutional: She appears well-developed and well-nourished. No distress.  Abdominal: Soft.  There is a erythematous indurated fluctuant area in the left lower quadrant the site of her previous transverse scar. It is draining purulent material.  I cleaned this and then went ahead and dilated the tract of the abscess with a hemostat and drained a moderate amount of purulent material. I then packed the abscess cavity with iodoform gauze and applied a bulky dressing.    Data Reviewed Note from Dr. Marin Olp  Assessment    Left lower quadrant abdominal wall abscess that has been drained.     Plan    She was instructed to remove the packing tomorrow to irrigate the area twice a  day with warm water. She's going up to [place] this weekend and said her family could help her with that. She was given a prescription for doxycycline. I've asked her to get her operative note from the colostomy closure to make sure this may not be a stitch abscess. Return visit 3-4 weeks.        Deryck Hippler J 04/17/2014, 6:10 PM

## 2014-04-17 NOTE — Progress Notes (Signed)
Pt is scheduled to see Dr. Dalbert Batman 04/17/14 @330  pm for her abdominal abcess. Pt verbalized understanding of the appointment time and location.

## 2014-04-17 NOTE — Telephone Encounter (Signed)
Mailed oct schedule °

## 2014-04-17 NOTE — Patient Instructions (Signed)
Remove bandage and packing tomorrow. Irrigate wound twice a day as we discussed. Cover it with a bulky dry bandage. Take antibiotics until they are gone.

## 2014-04-18 LAB — COMPREHENSIVE METABOLIC PANEL
ALBUMIN: 3.9 g/dL (ref 3.5–5.2)
ALK PHOS: 79 U/L (ref 39–117)
ALT: 8 U/L (ref 0–35)
AST: 14 U/L (ref 0–37)
BUN: 24 mg/dL — ABNORMAL HIGH (ref 6–23)
CALCIUM: 9.5 mg/dL (ref 8.4–10.5)
CHLORIDE: 99 meq/L (ref 96–112)
CO2: 31 mEq/L (ref 19–32)
Creatinine, Ser: 5.42 mg/dL — ABNORMAL HIGH (ref 0.50–1.10)
GLUCOSE: 94 mg/dL (ref 70–99)
POTASSIUM: 3.6 meq/L (ref 3.5–5.3)
SODIUM: 142 meq/L (ref 135–145)
TOTAL PROTEIN: 6.9 g/dL (ref 6.0–8.3)
Total Bilirubin: 0.6 mg/dL (ref 0.2–1.2)

## 2014-04-18 LAB — LACTATE DEHYDROGENASE: LDH: 115 U/L (ref 94–250)

## 2014-04-18 LAB — IGG, IGA, IGM
IGA: 81 mg/dL (ref 69–380)
IgG (Immunoglobin G), Serum: 936 mg/dL (ref 690–1700)
IgM, Serum: 1160 mg/dL — ABNORMAL HIGH (ref 52–322)

## 2014-04-18 LAB — PROTEIN ELECTROPHORESIS, SERUM, WITH REFLEX
ALBUMIN ELP: 52.5 % — AB (ref 55.8–66.1)
ALPHA-1-GLOBULIN: 5.5 % — AB (ref 2.9–4.9)
ALPHA-2-GLOBULIN: 11.1 % (ref 7.1–11.8)
Beta 2: 5 % (ref 3.2–6.5)
Beta Globulin: 5.1 % (ref 4.7–7.2)
Gamma Globulin: 20.8 % — ABNORMAL HIGH (ref 11.1–18.8)
M-Spike, %: 0.74 g/dL
Total Protein, Serum Electrophoresis: 6.9 g/dL (ref 6.0–8.3)

## 2014-04-18 LAB — KAPPA/LAMBDA LIGHT CHAINS
KAPPA LAMBDA RATIO: 1.16 (ref 0.26–1.65)
Kappa free light chain: 17.2 mg/dL — ABNORMAL HIGH (ref 0.33–1.94)
Lambda Free Lght Chn: 14.8 mg/dL — ABNORMAL HIGH (ref 0.57–2.63)

## 2014-04-18 LAB — IFE INTERPRETATION

## 2014-04-19 DIAGNOSIS — D631 Anemia in chronic kidney disease: Secondary | ICD-10-CM | POA: Diagnosis not present

## 2014-04-19 DIAGNOSIS — N186 End stage renal disease: Secondary | ICD-10-CM | POA: Diagnosis not present

## 2014-04-22 ENCOUNTER — Telehealth: Payer: Self-pay | Admitting: *Deleted

## 2014-04-22 NOTE — Telephone Encounter (Addendum)
Message copied by Lenn Sink on Tue Apr 22, 2014 10:55 AM ------      Message from: Volanda Napoleon      Created: Fri Apr 18, 2014  6:19 PM       Call - waldenstrom's level is still nice and low!! Laurey Arrow ------Informed pt that waldenstrom's level is still nice and low

## 2014-05-07 ENCOUNTER — Ambulatory Visit (INDEPENDENT_AMBULATORY_CARE_PROVIDER_SITE_OTHER): Payer: Medicare Other | Admitting: General Surgery

## 2014-05-07 ENCOUNTER — Encounter (INDEPENDENT_AMBULATORY_CARE_PROVIDER_SITE_OTHER): Payer: Self-pay | Admitting: General Surgery

## 2014-05-07 ENCOUNTER — Other Ambulatory Visit: Payer: Self-pay

## 2014-05-07 VITALS — BP 122/70 | HR 92 | Resp 16 | Ht 66.0 in | Wt 144.8 lb

## 2014-05-07 DIAGNOSIS — L03319 Cellulitis of trunk, unspecified: Principal | ICD-10-CM

## 2014-05-07 DIAGNOSIS — L02219 Cutaneous abscess of trunk, unspecified: Secondary | ICD-10-CM

## 2014-05-07 DIAGNOSIS — Z1231 Encounter for screening mammogram for malignant neoplasm of breast: Secondary | ICD-10-CM

## 2014-05-07 NOTE — Progress Notes (Signed)
Subjective:     Patient ID: Shannon Murray, female   DOB: Sep 10, 1946, 68 y.o.   MRN: [id number]  HPIshe reports of the abscess was healing well but recently has swelled up again. There has been minimal drainage.   Review of Systemsshe recently got back from [place]. She was not able to get her operative reports.     Objective:   Physical Exam Gen.-in no acute distress.  Abdomen-the left lower quadrant at the previous colostomy scar site there is some induration and a small amount of fluctuant drainage.  Procedure: This area were sterilely prepped. A cruciate incision was made after injection of local anesthetic. A small amount of purulent material was evacuated. Some necrotic tissue was debrided. The corners were cut from the cruciate incision creating a defect. The wound is packed with moist gauze followed by dry dressing.    Assessment:     Left lower quadrant abdominal wall abscess status post incision and drainage today.     Plan:     Wound care instructions were given to her. Return visit 2 weeks. I do not feel she needs antibiotics at this time given the drainage procedure.

## 2014-05-07 NOTE — Patient Instructions (Signed)
Remove bandage and packing Friday. Clean with warm water in the shower once a day beginning then and apply a dry dressing daily.

## 2014-05-12 DIAGNOSIS — N186 End stage renal disease: Secondary | ICD-10-CM | POA: Diagnosis not present

## 2014-05-13 DIAGNOSIS — D509 Iron deficiency anemia, unspecified: Secondary | ICD-10-CM | POA: Diagnosis not present

## 2014-05-13 DIAGNOSIS — N186 End stage renal disease: Secondary | ICD-10-CM | POA: Diagnosis not present

## 2014-05-13 DIAGNOSIS — D631 Anemia in chronic kidney disease: Secondary | ICD-10-CM | POA: Diagnosis not present

## 2014-05-14 DIAGNOSIS — H43819 Vitreous degeneration, unspecified eye: Secondary | ICD-10-CM | POA: Diagnosis not present

## 2014-05-14 DIAGNOSIS — H04129 Dry eye syndrome of unspecified lacrimal gland: Secondary | ICD-10-CM | POA: Diagnosis not present

## 2014-05-21 ENCOUNTER — Ambulatory Visit
Admission: RE | Admit: 2014-05-21 | Discharge: 2014-05-21 | Disposition: A | Discharge status: Home / Self Care | Payer: Medicare Other | Source: Ambulatory Visit

## 2014-05-21 ENCOUNTER — Encounter (INDEPENDENT_AMBULATORY_CARE_PROVIDER_SITE_OTHER): Payer: Medicare Other | Admitting: General Surgery

## 2014-05-21 DIAGNOSIS — Z1231 Encounter for screening mammogram for malignant neoplasm of breast: Secondary | ICD-10-CM

## 2014-05-21 DIAGNOSIS — L02219 Cutaneous abscess of trunk, unspecified: Secondary | ICD-10-CM | POA: Diagnosis not present

## 2014-05-21 DIAGNOSIS — L03319 Cellulitis of trunk, unspecified: Secondary | ICD-10-CM | POA: Diagnosis not present

## 2014-05-26 DIAGNOSIS — J309 Allergic rhinitis, unspecified: Secondary | ICD-10-CM | POA: Diagnosis not present

## 2014-05-26 DIAGNOSIS — C880 Waldenstrom macroglobulinemia: Secondary | ICD-10-CM | POA: Diagnosis not present

## 2014-05-26 DIAGNOSIS — J45909 Unspecified asthma, uncomplicated: Secondary | ICD-10-CM | POA: Diagnosis not present

## 2014-05-26 DIAGNOSIS — N289 Disorder of kidney and ureter, unspecified: Secondary | ICD-10-CM | POA: Diagnosis not present

## 2014-05-26 DIAGNOSIS — Z79899 Other long term (current) drug therapy: Secondary | ICD-10-CM | POA: Diagnosis not present

## 2014-06-11 DIAGNOSIS — N186 End stage renal disease: Secondary | ICD-10-CM | POA: Diagnosis not present

## 2014-06-12 DIAGNOSIS — N186 End stage renal disease: Secondary | ICD-10-CM | POA: Diagnosis not present

## 2014-06-12 DIAGNOSIS — D509 Iron deficiency anemia, unspecified: Secondary | ICD-10-CM | POA: Diagnosis not present

## 2014-06-12 DIAGNOSIS — Z23 Encounter for immunization: Secondary | ICD-10-CM | POA: Diagnosis not present

## 2014-06-19 ENCOUNTER — Ambulatory Visit (HOSPITAL_BASED_OUTPATIENT_CLINIC_OR_DEPARTMENT_OTHER): Payer: Medicare Other | Admitting: Lab

## 2014-06-19 ENCOUNTER — Encounter: Payer: Self-pay | Admitting: Hematology & Oncology

## 2014-06-19 ENCOUNTER — Ambulatory Visit (HOSPITAL_BASED_OUTPATIENT_CLINIC_OR_DEPARTMENT_OTHER): Payer: Medicare Other | Admitting: Hematology & Oncology

## 2014-06-19 ENCOUNTER — Ambulatory Visit (HOSPITAL_BASED_OUTPATIENT_CLINIC_OR_DEPARTMENT_OTHER): Payer: Medicare Other

## 2014-06-19 VITALS — BP 139/70 | HR 84 | Temp 98.4°F | Resp 14 | Ht 66.0 in | Wt 147.0 lb

## 2014-06-19 DIAGNOSIS — N19 Unspecified kidney failure: Secondary | ICD-10-CM

## 2014-06-19 DIAGNOSIS — D631 Anemia in chronic kidney disease: Secondary | ICD-10-CM

## 2014-06-19 DIAGNOSIS — C880 Waldenstrom macroglobulinemia: Secondary | ICD-10-CM

## 2014-06-19 DIAGNOSIS — L02211 Cutaneous abscess of abdominal wall: Secondary | ICD-10-CM

## 2014-06-19 DIAGNOSIS — D649 Anemia, unspecified: Secondary | ICD-10-CM | POA: Diagnosis not present

## 2014-06-19 DIAGNOSIS — Z23 Encounter for immunization: Secondary | ICD-10-CM

## 2014-06-19 DIAGNOSIS — N189 Chronic kidney disease, unspecified: Secondary | ICD-10-CM

## 2014-06-19 LAB — CBC WITH DIFFERENTIAL (CANCER CENTER ONLY)
BASO#: 0 10*3/uL (ref 0.0–0.2)
BASO%: 0.5 % (ref 0.0–2.0)
EOS%: 2.1 % (ref 0.0–7.0)
Eosinophils Absolute: 0.1 10*3/uL (ref 0.0–0.5)
HEMATOCRIT: 34.3 % — AB (ref 34.8–46.6)
HGB: 11.8 g/dL (ref 11.6–15.9)
LYMPH#: 2.3 10*3/uL (ref 0.9–3.3)
LYMPH%: 35.7 % (ref 14.0–48.0)
MCH: 31.2 pg (ref 26.0–34.0)
MCHC: 34.4 g/dL (ref 32.0–36.0)
MCV: 91 fL (ref 81–101)
MONO#: 0.6 10*3/uL (ref 0.1–0.9)
MONO%: 9.5 % (ref 0.0–13.0)
NEUT#: 3.4 10*3/uL (ref 1.5–6.5)
NEUT%: 52.2 % (ref 39.6–80.0)
Platelets: 328 10*3/uL (ref 145–400)
RBC: 3.78 10*6/uL (ref 3.70–5.32)
RDW: 16.2 % — AB (ref 11.1–15.7)
WBC: 6.5 10*3/uL (ref 3.9–10.0)

## 2014-06-19 MED ORDER — INFLUENZA VAC SPLIT QUAD 0.5 ML IM SUSY
0.5000 mL | PREFILLED_SYRINGE | Freq: Once | INTRAMUSCULAR | Status: AC
  Administered 2014-06-19: 0.5 mL via INTRAMUSCULAR
  Filled 2014-06-19: qty 0.5

## 2014-06-19 NOTE — Patient Instructions (Signed)

## 2014-06-19 NOTE — Progress Notes (Signed)
Hematology and Oncology Follow Up Visit  Shannon Murray [id number] Jun 14, 1946 68 y.o. 06/19/2014   Principle Diagnosis:   Waldenstrm's macroglobulinemia  Chronic renal failure-hemodialysis  Current Therapy:    Observation     Interim History:  Shannon Murray is back for followup. She's doing very well. She had her dialysis today.  When we first saw her, we had to send her to surgery. She had an abscess on her left anterior abdominal wall. This was drained. I think cultures were all negative.  When we first saw her, we repeated her protein studies. This showed a monoclonal spike of 0.74 g/L. Her IgG level was 1160 mg/dL. Her kappa light chain was 17.2 mg/dL.  She's been back up to [place]. She does not have to come back for a while.  Her iron studies will for saw her showed a ferritin of 598 with an iron saturation of 13%.  Her LDH was 115.  Her appetite has been pretty good. She's had no nausea or vomiting. She's had no cough. She's had no bleeding. There's been no diarrhea.  Medications: Current outpatient prescriptions:acyclovir (ZOVIRAX) 200 MG capsule, Take 200 mg by mouth 2 (two) times daily. , Disp: , Rfl: ;  B Complex-C-Folic Acid (RENAL MULTIVITAMIN FORMULA PO), Take by mouth., Disp: , Rfl: ;  Cetirizine HCl (ZYRTEC ALLERGY PO), Take by mouth every morning., Disp: , Rfl: ;  DULERA 200-5 MCG/ACT AERO, Inhale 2 puffs into the lungs 2 (two) times daily. , Disp: , Rfl:  RENVELA 800 MG tablet, Take by mouth 3 (three) times daily with meals. , Disp: , Rfl:   Allergies:  Allergies  Allergen Reactions  . Ciprofloxacin Itching  . Diphenhydramine Hcl Itching    Past Medical History, Surgical history, Social history, and Family History were reviewed and updated.  Review of Systems: As above  Physical Exam:  height is 5\' 6"  (1.676 m) and weight is 147 lb (66.679 kg). Her oral temperature is 98.4 F (36.9 C). Her blood pressure is 139/70 and her pulse is 84. Her  respiration is 14.   Thin but well-nourished African American female. Head and neck exam shows no ocular or oral lesions. She is no palpable cervical or supraclavicular lymph nodes. Lungs are clear. She does arouse, wheezes or rhonchi. Cardiac exam regular rate rhythm with a 1/6 systolic murmur. Abdomen is soft. The abscess site in her left lower abdomen is healed. There is still a little bit  of firmness and hyper pigmentation noted. No palpable liver or spleen tip is noted. Back exam shows no tenderness over the spine, ribs or hips. Extremities shows no clubbing, that cyanosis or edema. She has good strength. Skin exam shows no rashes, ecchymosis or petechia. Neurological exam is nonfocal.  Lab Results  Component Value Date   WBC 6.5 06/19/2014   HGB 11.8 06/19/2014   HCT 34.3* 06/19/2014   MCV 91 06/19/2014   PLT 328 06/19/2014     Chemistry      Component Value Date/Time   NA 142 04/16/2014 1414   NA 145 07/14/2011 0803   K 3.6 04/16/2014 1414   K 4.9* 07/14/2011 0803   CL 99 04/16/2014 1414   CL 108 07/14/2011 0803   CO2 31 04/16/2014 1414   CO2 25 07/14/2011 0803   BUN 24* 04/16/2014 1414   BUN 50* 07/14/2011 0803   CREATININE 5.42* 04/16/2014 1414   CREATININE 4.7* 07/14/2011 0803      Component Value Date/Time   CALCIUM  9.5 04/16/2014 1414   CALCIUM 10.7* 07/14/2011 0803   ALKPHOS 79 04/16/2014 1414   ALKPHOS 59 07/14/2011 0803   AST 14 04/16/2014 1414   AST 20 07/14/2011 0803   ALT 8 04/16/2014 1414   ALT 15 07/14/2011 0803   BILITOT 0.6 04/16/2014 1414   BILITOT 0.30 07/14/2011 0803         Impression and Plan: Shannon Murray is 68 year-old [place] American female. She has Waldenstrm's macroglobulinemia. She is being watched right now. She does not be any kind of therapy. We'll have to see what her labs look like today. She is a little bit more anemic. However, she is asymptomatic.  I will plan to get her back in another 2 months.  She got her flu shot today.   Volanda Napoleon,  MD 10/8/20154:55 PM

## 2014-06-23 DIAGNOSIS — Z124 Encounter for screening for malignant neoplasm of cervix: Secondary | ICD-10-CM | POA: Diagnosis not present

## 2014-06-23 DIAGNOSIS — Z01419 Encounter for gynecological examination (general) (routine) without abnormal findings: Secondary | ICD-10-CM | POA: Diagnosis not present

## 2014-06-23 LAB — PROTEIN ELECTROPHORESIS, SERUM, WITH REFLEX
ALBUMIN ELP: 53.5 % — AB (ref 55.8–66.1)
ALPHA-1-GLOBULIN: 4.7 % (ref 2.9–4.9)
ALPHA-2-GLOBULIN: 10.6 % (ref 7.1–11.8)
Beta 2: 4.4 % (ref 3.2–6.5)
Beta Globulin: 5.2 % (ref 4.7–7.2)
Gamma Globulin: 21.6 % — ABNORMAL HIGH (ref 11.1–18.8)
M-SPIKE, %: 0.74 g/dL
TOTAL PROTEIN, SERUM ELECTROPHOR: 6.7 g/dL (ref 6.0–8.3)

## 2014-06-23 LAB — COMPREHENSIVE METABOLIC PANEL
ALK PHOS: 98 U/L (ref 39–117)
ALT: 9 U/L (ref 0–35)
AST: 16 U/L (ref 0–37)
Albumin: 3.6 g/dL (ref 3.5–5.2)
BILIRUBIN TOTAL: 0.5 mg/dL (ref 0.2–1.2)
BUN: 14 mg/dL (ref 6–23)
CO2: 34 mEq/L — ABNORMAL HIGH (ref 19–32)
Calcium: 8.9 mg/dL (ref 8.4–10.5)
Chloride: 99 mEq/L (ref 96–112)
Creatinine, Ser: 3.1 mg/dL — ABNORMAL HIGH (ref 0.50–1.10)
GLUCOSE: 97 mg/dL (ref 70–99)
Potassium: 3.5 mEq/L (ref 3.5–5.3)
SODIUM: 143 meq/L (ref 135–145)
Total Protein: 6.7 g/dL (ref 6.0–8.3)

## 2014-06-23 LAB — IGG, IGA, IGM
IgA: 73 mg/dL (ref 69–380)
IgG (Immunoglobin G), Serum: 851 mg/dL (ref 690–1700)
IgM, Serum: 1240 mg/dL — ABNORMAL HIGH (ref 52–322)

## 2014-06-23 LAB — KAPPA/LAMBDA LIGHT CHAINS
KAPPA FREE LGHT CHN: 19 mg/dL — AB (ref 0.33–1.94)
Kappa:Lambda Ratio: 1.28 (ref 0.26–1.65)
Lambda Free Lght Chn: 14.9 mg/dL — ABNORMAL HIGH (ref 0.57–2.63)

## 2014-06-23 LAB — LACTATE DEHYDROGENASE: LDH: 109 U/L (ref 94–250)

## 2014-06-23 LAB — IFE INTERPRETATION

## 2014-07-12 DIAGNOSIS — Z992 Dependence on renal dialysis: Secondary | ICD-10-CM | POA: Diagnosis not present

## 2014-07-12 DIAGNOSIS — N186 End stage renal disease: Secondary | ICD-10-CM | POA: Diagnosis not present

## 2014-07-14 ENCOUNTER — Encounter: Payer: Self-pay | Admitting: Hematology & Oncology

## 2014-07-15 DIAGNOSIS — D631 Anemia in chronic kidney disease: Secondary | ICD-10-CM | POA: Diagnosis not present

## 2014-07-15 DIAGNOSIS — D509 Iron deficiency anemia, unspecified: Secondary | ICD-10-CM | POA: Diagnosis not present

## 2014-07-15 DIAGNOSIS — N186 End stage renal disease: Secondary | ICD-10-CM | POA: Diagnosis not present

## 2014-07-15 DIAGNOSIS — Z23 Encounter for immunization: Secondary | ICD-10-CM | POA: Diagnosis not present

## 2014-07-21 DIAGNOSIS — M8580 Other specified disorders of bone density and structure, unspecified site: Secondary | ICD-10-CM | POA: Diagnosis not present

## 2014-08-11 DIAGNOSIS — N182 Chronic kidney disease, stage 2 (mild): Secondary | ICD-10-CM | POA: Diagnosis not present

## 2014-08-11 DIAGNOSIS — Z992 Dependence on renal dialysis: Secondary | ICD-10-CM | POA: Diagnosis not present

## 2014-08-12 DIAGNOSIS — Z23 Encounter for immunization: Secondary | ICD-10-CM | POA: Diagnosis not present

## 2014-08-12 DIAGNOSIS — N2581 Secondary hyperparathyroidism of renal origin: Secondary | ICD-10-CM | POA: Diagnosis not present

## 2014-08-12 DIAGNOSIS — N186 End stage renal disease: Secondary | ICD-10-CM | POA: Diagnosis not present

## 2014-08-12 DIAGNOSIS — D509 Iron deficiency anemia, unspecified: Secondary | ICD-10-CM | POA: Diagnosis not present

## 2014-08-14 DIAGNOSIS — N186 End stage renal disease: Secondary | ICD-10-CM | POA: Diagnosis not present

## 2014-08-14 DIAGNOSIS — D509 Iron deficiency anemia, unspecified: Secondary | ICD-10-CM | POA: Diagnosis not present

## 2014-08-14 DIAGNOSIS — N2581 Secondary hyperparathyroidism of renal origin: Secondary | ICD-10-CM | POA: Diagnosis not present

## 2014-08-14 DIAGNOSIS — Z23 Encounter for immunization: Secondary | ICD-10-CM | POA: Diagnosis not present

## 2014-08-16 DIAGNOSIS — N186 End stage renal disease: Secondary | ICD-10-CM | POA: Diagnosis not present

## 2014-08-16 DIAGNOSIS — N2581 Secondary hyperparathyroidism of renal origin: Secondary | ICD-10-CM | POA: Diagnosis not present

## 2014-08-16 DIAGNOSIS — Z23 Encounter for immunization: Secondary | ICD-10-CM | POA: Diagnosis not present

## 2014-08-16 DIAGNOSIS — D509 Iron deficiency anemia, unspecified: Secondary | ICD-10-CM | POA: Diagnosis not present

## 2014-08-19 DIAGNOSIS — D509 Iron deficiency anemia, unspecified: Secondary | ICD-10-CM | POA: Diagnosis not present

## 2014-08-19 DIAGNOSIS — Z23 Encounter for immunization: Secondary | ICD-10-CM | POA: Diagnosis not present

## 2014-08-19 DIAGNOSIS — N186 End stage renal disease: Secondary | ICD-10-CM | POA: Diagnosis not present

## 2014-08-19 DIAGNOSIS — N2581 Secondary hyperparathyroidism of renal origin: Secondary | ICD-10-CM | POA: Diagnosis not present

## 2014-08-20 ENCOUNTER — Ambulatory Visit (HOSPITAL_BASED_OUTPATIENT_CLINIC_OR_DEPARTMENT_OTHER): Payer: Medicare Other

## 2014-08-20 ENCOUNTER — Ambulatory Visit (HOSPITAL_BASED_OUTPATIENT_CLINIC_OR_DEPARTMENT_OTHER): Payer: Medicare Other | Admitting: Lab

## 2014-08-20 ENCOUNTER — Ambulatory Visit (HOSPITAL_BASED_OUTPATIENT_CLINIC_OR_DEPARTMENT_OTHER): Payer: Medicare Other | Admitting: Family

## 2014-08-20 ENCOUNTER — Encounter: Payer: Self-pay | Admitting: Family

## 2014-08-20 VITALS — BP 93/75 | HR 88 | Temp 98.1°F | Resp 14 | Ht 66.0 in | Wt 148.0 lb

## 2014-08-20 DIAGNOSIS — R5382 Chronic fatigue, unspecified: Secondary | ICD-10-CM

## 2014-08-20 DIAGNOSIS — N19 Unspecified kidney failure: Secondary | ICD-10-CM | POA: Diagnosis not present

## 2014-08-20 DIAGNOSIS — C8800 Waldenstrom macroglobulinemia not having achieved remission: Secondary | ICD-10-CM

## 2014-08-20 DIAGNOSIS — M810 Age-related osteoporosis without current pathological fracture: Secondary | ICD-10-CM

## 2014-08-20 DIAGNOSIS — E538 Deficiency of other specified B group vitamins: Secondary | ICD-10-CM

## 2014-08-20 DIAGNOSIS — R5383 Other fatigue: Secondary | ICD-10-CM | POA: Insufficient documentation

## 2014-08-20 DIAGNOSIS — Z992 Dependence on renal dialysis: Secondary | ICD-10-CM | POA: Diagnosis not present

## 2014-08-20 LAB — CBC WITH DIFFERENTIAL (CANCER CENTER ONLY)
BASO#: 0 10*3/uL (ref 0.0–0.2)
BASO%: 0.4 % (ref 0.0–2.0)
EOS ABS: 0.2 10*3/uL (ref 0.0–0.5)
EOS%: 2 % (ref 0.0–7.0)
HEMATOCRIT: 34.6 % — AB (ref 34.8–46.6)
HEMOGLOBIN: 11.5 g/dL — AB (ref 11.6–15.9)
LYMPH#: 2.8 10*3/uL (ref 0.9–3.3)
LYMPH%: 38.5 % (ref 14.0–48.0)
MCH: 30.7 pg (ref 26.0–34.0)
MCHC: 33.2 g/dL (ref 32.0–36.0)
MCV: 92 fL (ref 81–101)
MONO#: 0.5 10*3/uL (ref 0.1–0.9)
MONO%: 7.1 % (ref 0.0–13.0)
NEUT#: 3.8 10*3/uL (ref 1.5–6.5)
NEUT%: 52 % (ref 39.6–80.0)
Platelets: 319 10*3/uL (ref 145–400)
RBC: 3.75 10*6/uL (ref 3.70–5.32)
RDW: 14.5 % (ref 11.1–15.7)
WBC: 7.4 10*3/uL (ref 3.9–10.0)

## 2014-08-20 LAB — CMP (CANCER CENTER ONLY)
ALK PHOS: 84 U/L (ref 26–84)
ALT: 11 U/L (ref 10–47)
AST: 23 U/L (ref 11–38)
Albumin: 2.9 g/dL — ABNORMAL LOW (ref 3.3–5.5)
BILIRUBIN TOTAL: 0.5 mg/dL (ref 0.20–1.60)
BUN, Bld: 27 mg/dL — ABNORMAL HIGH (ref 7–22)
CO2: 32 mEq/L (ref 18–33)
Calcium: 9.1 mg/dL (ref 8.0–10.3)
Chloride: 94 mEq/L — ABNORMAL LOW (ref 98–108)
Creat: 5.4 mg/dl (ref 0.6–1.2)
Glucose, Bld: 115 mg/dL (ref 73–118)
Potassium: 3.8 mEq/L (ref 3.3–4.7)
SODIUM: 142 meq/L (ref 128–145)
TOTAL PROTEIN: 6.9 g/dL (ref 6.4–8.1)

## 2014-08-20 MED ORDER — CYANOCOBALAMIN 1000 MCG/ML IJ SOLN
INTRAMUSCULAR | Status: AC
  Filled 2014-08-20: qty 1

## 2014-08-20 MED ORDER — CYANOCOBALAMIN 1000 MCG/ML IJ SOLN
1000.0000 ug | Freq: Once | INTRAMUSCULAR | Status: AC
  Administered 2014-08-20: 1000 ug via INTRAMUSCULAR

## 2014-08-20 NOTE — Progress Notes (Signed)
[facility]  [phone] [phone]  ID: Shannon Murray OB: 1945-12-25 MR#: [id number] TIR#:[id number] Patient Care Team: Glendale Chard, MD as PCP - General (Internal Medicine)  DIAGNOSIS:  Shannon Murray macroglobulinemia Chronic renal failure-hemodialysis  INTERVAL HISTORY: [name] is here today for a follow-up. She is doing well. She just got back from visiting her family in [place]. They had a great time.  She continues to get hemodialysis (Tues, Thurs, Sat) and has not had any issues.  The abscess on her abdomen has healed. The cultured taken from this when is was drained were negative.  She denies fever, chills, n/v, cough, rash, headache, dizziness, SOB, chest pain, palpitations, abdominal pain, constipation, diarrhea, blood in stool.  No swelling, tenderness, numbness or tingling in her extremities. No pain.  Her appetite is good. She restricts her fluids because of the dialysis so as not to become fluid overloaded. Her weight is stable.  In October she had a monoclonal spike of 0.74 g/L. Her IgG level was 1240 mg/dL. Her kappa light chain was 19 mg/dL. Her LDH was 109.  CURRENT TREATMENT: Observation  REVIEW OF SYSTEMS: All other 10 point review of systems is negative.   PAST MEDICAL HISTORY: Past Medical History  Diagnosis Date  . 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 Renal failure 09/23/2011  . Diverticulitis of large intestine 09/23/2011  . Abnormal Pap smear     ASCUS   . Cancer     malig lymphoplasmacytic lymphoma  . Breast calcification seen on mammogram 2010  . Pain, pelvic, female 2009    PAST SURGICAL HISTORY: Past Surgical History  Procedure Laterality Date  . Total abdominal hysterectomy    . Tonsillectomy    . Wisdom tooth extraction      FAMILY HISTORY Family History  Problem Relation Age of Onset  . Cancer Mother   . Cancer Father   . Learning disabilities Father   . Cancer Brother   . Learning  disabilities Brother   . Hypertension Brother     GYNECOLOGIC HISTORY:  No LMP recorded. Patient has had a hysterectomy.   SOCIAL HISTORY: History   Social History  . Marital Status: Single    Spouse Name: N/A    Number of Children: N/A  . Years of Education: N/A   Occupational History  . Not on file.   Social History Main Topics  . Smoking status: Never Smoker   . Smokeless tobacco: Never Used     Comment: Never Used Tobacco  . Alcohol Use: No  . Drug Use: No  . Sexual Activity: No     Comment: Hyst   Other Topics Concern  . Not on file   Social History Narrative    ADVANCED DIRECTIVES:  <no information>  HEALTH MAINTENANCE: History  Substance Use Topics  . Smoking status: Never Smoker   . Smokeless tobacco: Never Used     Comment: Never Used Tobacco  . Alcohol Use: No   Colonoscopy: PAP: Bone density: Lipid panel:  Allergies  Allergen Reactions  . Ciprofloxacin Itching  . Diphenhydramine Hcl Itching    Current Outpatient Prescriptions  Medication Sig Dispense Refill  . acyclovir (ZOVIRAX) 200 MG capsule Take 200 mg by mouth 2 (two) times daily.     . B Complex-C-Folic Acid (RENAL MULTIVITAMIN FORMULA PO) Take by mouth.    . Cetirizine HCl (ZYRTEC ALLERGY PO) Take by mouth every morning.    . DULERA 200-5 MCG/ACT AERO Inhale 2 puffs into the lungs 2 (two)  times daily.     Marland Kitchen RENVELA 800 MG tablet Take by mouth 3 (three) times daily with meals.      No current facility-administered medications for this visit.    OBJECTIVE: Filed Vitals:   08/20/14 1326  BP: 93/75  Pulse: 88  Temp: 98.1 F (36.7 C)  Resp: 14    Filed Weights   08/20/14 1326  Weight: 148 lb (67.132 kg)   ECOG FS:0 - Asymptomatic Ocular: Sclerae unicteric, pupils equal, round and reactive to light Ear-nose-throat: Oropharynx clear, dentition fair Lymphatic: No cervical or supraclavicular adenopathy Lungs no rales or rhonchi, good excursion bilaterally Heart regular rate  and rhythm, no murmur appreciated Abd soft, nontender, positive bowel sounds MSK no focal spinal tenderness, no joint edema Neuro: non-focal, well-oriented, appropriate affect Breasts: Deferred  LAB RESULTS: CMP     Component Value Date/Time   NA 143 06/19/2014 1507   NA 145 07/14/2011 0803   K 3.5 06/19/2014 1507   K 4.9* 07/14/2011 0803   CL 99 06/19/2014 1507   CL 108 07/14/2011 0803   CO2 34* 06/19/2014 1507   CO2 25 07/14/2011 0803   GLUCOSE 97 06/19/2014 1507   GLUCOSE 87 07/14/2011 0803   BUN 14 06/19/2014 1507   BUN 50* 07/14/2011 0803   CREATININE 3.10* 06/19/2014 1507   CREATININE 4.7* 07/14/2011 0803   CALCIUM 8.9 06/19/2014 1507   CALCIUM 10.7* 07/14/2011 0803   PROT 6.7 06/19/2014 1507   PROT 7.4 07/14/2011 0803   ALBUMIN 3.6 06/19/2014 1507   AST 16 06/19/2014 1507   AST 20 07/14/2011 0803   ALT 9 06/19/2014 1507   ALT 15 07/14/2011 0803   ALKPHOS 98 06/19/2014 1507   ALKPHOS 59 07/14/2011 0803   BILITOT 0.5 06/19/2014 1507   BILITOT 0.30 07/14/2011 0803   INo results found for: SPEP, UPEP Lab Results  Component Value Date   WBC 6.5 06/19/2014   NEUTROABS 3.4 06/19/2014   HGB 11.8 06/19/2014   HCT 34.3* 06/19/2014   MCV 91 06/19/2014   PLT 328 06/19/2014   No results found for: LABCA2 No components found for: ZOXWR604 No results for input(s): INR in the last 168 hours. Urinalysis No results found for: COLORURINE, APPEARANCEUR, LABSPEC, PHURINE, GLUCOSEU, HGBUR, BILIRUBINUR, KETONESUR, PROTEINUR, UROBILINOGEN, NITRITE, LEUKOCYTESUR STUDIES: No results found.  ASSESSMENT/PLAN: Ms. [name] is 68 year-old [place] American female with Waldenstrm's macroglobulinemia. She is asymptomatic at this time.  Her CBC today was good. We will wait and see what the rest of her labs show.  She is doing well with dialysis.  She was recently diagnoses with osteoporosis while in [place]. She will get Prolia injections every 6 months.  She is also requesting  vitamin B12 injections since she got them while in [place]. We will do B12 1 mg injections every 3 months.  We will see her back in 3 months for labs and follow-up.  She knows to call here with any questions or concerns and to go to the ED in the event of an emergency. We can certainly see her back sooner if need be.   Eliezer Bottom, NP 08/20/2014 1:34 PM

## 2014-08-21 DIAGNOSIS — D509 Iron deficiency anemia, unspecified: Secondary | ICD-10-CM | POA: Diagnosis not present

## 2014-08-21 DIAGNOSIS — N2581 Secondary hyperparathyroidism of renal origin: Secondary | ICD-10-CM | POA: Diagnosis not present

## 2014-08-21 DIAGNOSIS — N186 End stage renal disease: Secondary | ICD-10-CM | POA: Diagnosis not present

## 2014-08-21 DIAGNOSIS — Z23 Encounter for immunization: Secondary | ICD-10-CM | POA: Diagnosis not present

## 2014-08-22 LAB — PROTEIN ELECTROPHORESIS, SERUM, WITH REFLEX
Albumin ELP: 50.2 % — ABNORMAL LOW (ref 55.8–66.1)
Alpha-1-Globulin: 5.3 % — ABNORMAL HIGH (ref 2.9–4.9)
Alpha-2-Globulin: 11 % (ref 7.1–11.8)
Beta 2: 5.5 % (ref 3.2–6.5)
Beta Globulin: 5.1 % (ref 4.7–7.2)
GAMMA GLOBULIN: 22.9 % — AB (ref 11.1–18.8)
M-SPIKE, %: 0.9 g/dL
Total Protein, Serum Electrophoresis: 7.4 g/dL (ref 6.0–8.3)

## 2014-08-22 LAB — IGG, IGA, IGM
IGA: 80 mg/dL (ref 69–380)
IGM, SERUM: 1250 mg/dL — AB (ref 52–322)
IgG (Immunoglobin G), Serum: 960 mg/dL (ref 690–1700)

## 2014-08-22 LAB — IFE INTERPRETATION

## 2014-08-22 LAB — KAPPA/LAMBDA LIGHT CHAINS
KAPPA LAMBDA RATIO: 0.97 (ref 0.26–1.65)
Kappa free light chain: 18.9 mg/dL — ABNORMAL HIGH (ref 0.33–1.94)
Lambda Free Lght Chn: 19.5 mg/dL — ABNORMAL HIGH (ref 0.57–2.63)

## 2014-08-23 DIAGNOSIS — N186 End stage renal disease: Secondary | ICD-10-CM | POA: Diagnosis not present

## 2014-08-23 DIAGNOSIS — Z23 Encounter for immunization: Secondary | ICD-10-CM | POA: Diagnosis not present

## 2014-08-23 DIAGNOSIS — N2581 Secondary hyperparathyroidism of renal origin: Secondary | ICD-10-CM | POA: Diagnosis not present

## 2014-08-23 DIAGNOSIS — D509 Iron deficiency anemia, unspecified: Secondary | ICD-10-CM | POA: Diagnosis not present

## 2014-08-25 ENCOUNTER — Telehealth: Payer: Self-pay | Admitting: *Deleted

## 2014-08-25 ENCOUNTER — Ambulatory Visit (HOSPITAL_BASED_OUTPATIENT_CLINIC_OR_DEPARTMENT_OTHER): Payer: Medicare Other

## 2014-08-25 ENCOUNTER — Other Ambulatory Visit: Payer: Self-pay | Admitting: Family

## 2014-08-25 ENCOUNTER — Encounter: Payer: Self-pay | Admitting: Hematology & Oncology

## 2014-08-25 VITALS — BP 122/68 | HR 89 | Temp 98.8°F | Resp 18

## 2014-08-25 DIAGNOSIS — N19 Unspecified kidney failure: Secondary | ICD-10-CM

## 2014-08-25 DIAGNOSIS — M810 Age-related osteoporosis without current pathological fracture: Secondary | ICD-10-CM

## 2014-08-25 DIAGNOSIS — C880 Waldenstrom macroglobulinemia: Secondary | ICD-10-CM

## 2014-08-25 MED ORDER — DENOSUMAB 60 MG/ML ~~LOC~~ SOLN
60.0000 mg | Freq: Once | SUBCUTANEOUS | Status: AC
  Administered 2014-08-25: 60 mg via SUBCUTANEOUS
  Filled 2014-08-25: qty 1

## 2014-08-25 NOTE — Telephone Encounter (Addendum)
Patient was seen in office today. She was told message below in person.   ----- Message from Volanda Napoleon, MD sent at 08/22/2014  5:23 PM EST ----- Please call and let her know that the labs look pretty stable. Laurey Arrow

## 2014-08-25 NOTE — Patient Instructions (Signed)
Denosumab injection What is this medicine? DENOSUMAB (den oh sue mab) slows bone breakdown. Prolia is used to treat osteoporosis in women after menopause and in men. Xgeva is used to prevent bone fractures and other bone problems caused by cancer bone metastases. Xgeva is also used to treat giant cell tumor of the bone. This medicine may be used for other purposes; ask your health care provider or pharmacist if you have questions. COMMON BRAND NAME(S): Prolia, XGEVA What should I tell my health care provider before I take this medicine? They need to know if you have any of these conditions: -dental disease -eczema -infection or history of infections -kidney disease or on dialysis -low blood calcium or vitamin D -malabsorption syndrome -scheduled to have surgery or tooth extraction -taking medicine that contains denosumab -thyroid or parathyroid disease -an unusual reaction to denosumab, other medicines, foods, dyes, or preservatives -pregnant or trying to get pregnant -breast-feeding How should I use this medicine? This medicine is for injection under the skin. It is given by a health care professional in a hospital or clinic setting. If you are getting Prolia, a special MedGuide will be given to you by the pharmacist with each prescription and refill. Be sure to read this information carefully each time. For Prolia, talk to your pediatrician regarding the use of this medicine in children. Special care may be needed. For Xgeva, talk to your pediatrician regarding the use of this medicine in children. While this drug may be prescribed for children as young as 13 years for selected conditions, precautions do apply. Overdosage: If you think you've taken too much of this medicine contact a poison control center or emergency room at once. Overdosage: If you think you have taken too much of this medicine contact a poison control center or emergency room at once. NOTE: This medicine is only for  you. Do not share this medicine with others. What if I miss a dose? It is important not to miss your dose. Call your doctor or health care professional if you are unable to keep an appointment. What may interact with this medicine? Do not take this medicine with any of the following medications: -other medicines containing denosumab This medicine may also interact with the following medications: -medicines that suppress the immune system -medicines that treat cancer -steroid medicines like prednisone or cortisone This list may not describe all possible interactions. Give your health care provider a list of all the medicines, herbs, non-prescription drugs, or dietary supplements you use. Also tell them if you smoke, drink alcohol, or use illegal drugs. Some items may interact with your medicine. What should I watch for while using this medicine? Visit your doctor or health care professional for regular checks on your progress. Your doctor or health care professional may order blood tests and other tests to see how you are doing. Call your doctor or health care professional if you get a cold or other infection while receiving this medicine. Do not treat yourself. This medicine may decrease your body's ability to fight infection. You should make sure you get enough calcium and vitamin D while you are taking this medicine, unless your doctor tells you not to. Discuss the foods you eat and the vitamins you take with your health care professional. See your dentist regularly. Brush and floss your teeth as directed. Before you have any dental work done, tell your dentist you are receiving this medicine. Do not become pregnant while taking this medicine or for 5 months after stopping   it. Women should inform their doctor if they wish to become pregnant or think they might be pregnant. There is a potential for serious side effects to an unborn child. Talk to your health care professional or pharmacist for more  information. What side effects may I notice from receiving this medicine? Side effects that you should report to your doctor or health care professional as soon as possible: -allergic reactions like skin rash, itching or hives, swelling of the face, lips, or tongue -breathing problems -chest pain -fast, irregular heartbeat -feeling faint or lightheaded, falls -fever, chills, or any other sign of infection -muscle spasms, tightening, or twitches -numbness or tingling -skin blisters or bumps, or is dry, peels, or red -slow healing or unexplained pain in the mouth or jaw -unusual bleeding or bruising Side effects that usually do not require medical attention (Report these to your doctor or health care professional if they continue or are bothersome.): -muscle pain -stomach upset, gas This list may not describe all possible side effects. Call your doctor for medical advice about side effects. You may report side effects to FDA at 1-800-FDA-1088. Where should I keep my medicine? This medicine is only given in a clinic, doctor's office, or other health care setting and will not be stored at home. NOTE: This sheet is a summary. It may not cover all possible information. If you have questions about this medicine, talk to your doctor, pharmacist, or health care provider.  2015, [employer] Standard. (2012-02-27 12:37:47)  

## 2014-08-26 DIAGNOSIS — Z23 Encounter for immunization: Secondary | ICD-10-CM | POA: Diagnosis not present

## 2014-08-26 DIAGNOSIS — N2581 Secondary hyperparathyroidism of renal origin: Secondary | ICD-10-CM | POA: Diagnosis not present

## 2014-08-26 DIAGNOSIS — N186 End stage renal disease: Secondary | ICD-10-CM | POA: Diagnosis not present

## 2014-08-26 DIAGNOSIS — D509 Iron deficiency anemia, unspecified: Secondary | ICD-10-CM | POA: Diagnosis not present

## 2014-08-28 DIAGNOSIS — N186 End stage renal disease: Secondary | ICD-10-CM | POA: Diagnosis not present

## 2014-08-28 DIAGNOSIS — D509 Iron deficiency anemia, unspecified: Secondary | ICD-10-CM | POA: Diagnosis not present

## 2014-08-28 DIAGNOSIS — N2581 Secondary hyperparathyroidism of renal origin: Secondary | ICD-10-CM | POA: Diagnosis not present

## 2014-08-28 DIAGNOSIS — Z23 Encounter for immunization: Secondary | ICD-10-CM | POA: Diagnosis not present

## 2014-08-30 DIAGNOSIS — N186 End stage renal disease: Secondary | ICD-10-CM | POA: Diagnosis not present

## 2014-08-30 DIAGNOSIS — D509 Iron deficiency anemia, unspecified: Secondary | ICD-10-CM | POA: Diagnosis not present

## 2014-08-30 DIAGNOSIS — Z23 Encounter for immunization: Secondary | ICD-10-CM | POA: Diagnosis not present

## 2014-08-30 DIAGNOSIS — N2581 Secondary hyperparathyroidism of renal origin: Secondary | ICD-10-CM | POA: Diagnosis not present

## 2014-09-02 DIAGNOSIS — N186 End stage renal disease: Secondary | ICD-10-CM | POA: Diagnosis not present

## 2014-09-02 DIAGNOSIS — Z23 Encounter for immunization: Secondary | ICD-10-CM | POA: Diagnosis not present

## 2014-09-02 DIAGNOSIS — N2581 Secondary hyperparathyroidism of renal origin: Secondary | ICD-10-CM | POA: Diagnosis not present

## 2014-09-02 DIAGNOSIS — D509 Iron deficiency anemia, unspecified: Secondary | ICD-10-CM | POA: Diagnosis not present

## 2014-09-04 DIAGNOSIS — N2581 Secondary hyperparathyroidism of renal origin: Secondary | ICD-10-CM | POA: Diagnosis not present

## 2014-09-04 DIAGNOSIS — D509 Iron deficiency anemia, unspecified: Secondary | ICD-10-CM | POA: Diagnosis not present

## 2014-09-04 DIAGNOSIS — N186 End stage renal disease: Secondary | ICD-10-CM | POA: Diagnosis not present

## 2014-09-04 DIAGNOSIS — Z23 Encounter for immunization: Secondary | ICD-10-CM | POA: Diagnosis not present

## 2014-09-07 DIAGNOSIS — N186 End stage renal disease: Secondary | ICD-10-CM | POA: Diagnosis not present

## 2014-09-09 DIAGNOSIS — N186 End stage renal disease: Secondary | ICD-10-CM | POA: Diagnosis not present

## 2014-09-11 DIAGNOSIS — D509 Iron deficiency anemia, unspecified: Secondary | ICD-10-CM | POA: Diagnosis not present

## 2014-09-11 DIAGNOSIS — N186 End stage renal disease: Secondary | ICD-10-CM | POA: Diagnosis not present

## 2014-09-11 DIAGNOSIS — Z992 Dependence on renal dialysis: Secondary | ICD-10-CM | POA: Diagnosis not present

## 2014-09-11 DIAGNOSIS — N2581 Secondary hyperparathyroidism of renal origin: Secondary | ICD-10-CM | POA: Diagnosis not present

## 2014-09-11 DIAGNOSIS — Z23 Encounter for immunization: Secondary | ICD-10-CM | POA: Diagnosis not present

## 2014-09-14 DIAGNOSIS — D509 Iron deficiency anemia, unspecified: Secondary | ICD-10-CM | POA: Diagnosis not present

## 2014-09-14 DIAGNOSIS — N2581 Secondary hyperparathyroidism of renal origin: Secondary | ICD-10-CM | POA: Diagnosis not present

## 2014-09-14 DIAGNOSIS — N186 End stage renal disease: Secondary | ICD-10-CM | POA: Diagnosis not present

## 2014-09-16 DIAGNOSIS — N2581 Secondary hyperparathyroidism of renal origin: Secondary | ICD-10-CM | POA: Diagnosis not present

## 2014-09-16 DIAGNOSIS — N186 End stage renal disease: Secondary | ICD-10-CM | POA: Diagnosis not present

## 2014-09-16 DIAGNOSIS — D509 Iron deficiency anemia, unspecified: Secondary | ICD-10-CM | POA: Diagnosis not present

## 2014-09-18 DIAGNOSIS — N186 End stage renal disease: Secondary | ICD-10-CM | POA: Diagnosis not present

## 2014-09-18 DIAGNOSIS — N2581 Secondary hyperparathyroidism of renal origin: Secondary | ICD-10-CM | POA: Diagnosis not present

## 2014-09-18 DIAGNOSIS — D509 Iron deficiency anemia, unspecified: Secondary | ICD-10-CM | POA: Diagnosis not present

## 2014-09-20 DIAGNOSIS — N2581 Secondary hyperparathyroidism of renal origin: Secondary | ICD-10-CM | POA: Diagnosis not present

## 2014-09-20 DIAGNOSIS — N186 End stage renal disease: Secondary | ICD-10-CM | POA: Diagnosis not present

## 2014-09-20 DIAGNOSIS — D509 Iron deficiency anemia, unspecified: Secondary | ICD-10-CM | POA: Diagnosis not present

## 2014-09-23 DIAGNOSIS — N2581 Secondary hyperparathyroidism of renal origin: Secondary | ICD-10-CM | POA: Diagnosis not present

## 2014-09-23 DIAGNOSIS — N186 End stage renal disease: Secondary | ICD-10-CM | POA: Diagnosis not present

## 2014-09-23 DIAGNOSIS — D509 Iron deficiency anemia, unspecified: Secondary | ICD-10-CM | POA: Diagnosis not present

## 2014-09-25 DIAGNOSIS — D509 Iron deficiency anemia, unspecified: Secondary | ICD-10-CM | POA: Diagnosis not present

## 2014-09-25 DIAGNOSIS — N2581 Secondary hyperparathyroidism of renal origin: Secondary | ICD-10-CM | POA: Diagnosis not present

## 2014-09-25 DIAGNOSIS — N186 End stage renal disease: Secondary | ICD-10-CM | POA: Diagnosis not present

## 2014-09-27 DIAGNOSIS — D509 Iron deficiency anemia, unspecified: Secondary | ICD-10-CM | POA: Diagnosis not present

## 2014-09-27 DIAGNOSIS — N2581 Secondary hyperparathyroidism of renal origin: Secondary | ICD-10-CM | POA: Diagnosis not present

## 2014-09-27 DIAGNOSIS — N186 End stage renal disease: Secondary | ICD-10-CM | POA: Diagnosis not present

## 2014-09-30 DIAGNOSIS — N2581 Secondary hyperparathyroidism of renal origin: Secondary | ICD-10-CM | POA: Diagnosis not present

## 2014-09-30 DIAGNOSIS — N186 End stage renal disease: Secondary | ICD-10-CM | POA: Diagnosis not present

## 2014-09-30 DIAGNOSIS — D509 Iron deficiency anemia, unspecified: Secondary | ICD-10-CM | POA: Diagnosis not present

## 2014-10-02 DIAGNOSIS — N2581 Secondary hyperparathyroidism of renal origin: Secondary | ICD-10-CM | POA: Diagnosis not present

## 2014-10-02 DIAGNOSIS — N186 End stage renal disease: Secondary | ICD-10-CM | POA: Diagnosis not present

## 2014-10-02 DIAGNOSIS — D509 Iron deficiency anemia, unspecified: Secondary | ICD-10-CM | POA: Diagnosis not present

## 2014-10-04 DIAGNOSIS — D509 Iron deficiency anemia, unspecified: Secondary | ICD-10-CM | POA: Diagnosis not present

## 2014-10-04 DIAGNOSIS — N186 End stage renal disease: Secondary | ICD-10-CM | POA: Diagnosis not present

## 2014-10-04 DIAGNOSIS — N2581 Secondary hyperparathyroidism of renal origin: Secondary | ICD-10-CM | POA: Diagnosis not present

## 2014-10-07 DIAGNOSIS — N186 End stage renal disease: Secondary | ICD-10-CM | POA: Diagnosis not present

## 2014-10-07 DIAGNOSIS — D509 Iron deficiency anemia, unspecified: Secondary | ICD-10-CM | POA: Diagnosis not present

## 2014-10-07 DIAGNOSIS — N2581 Secondary hyperparathyroidism of renal origin: Secondary | ICD-10-CM | POA: Diagnosis not present

## 2014-10-09 DIAGNOSIS — N186 End stage renal disease: Secondary | ICD-10-CM | POA: Diagnosis not present

## 2014-10-09 DIAGNOSIS — N2581 Secondary hyperparathyroidism of renal origin: Secondary | ICD-10-CM | POA: Diagnosis not present

## 2014-10-09 DIAGNOSIS — D509 Iron deficiency anemia, unspecified: Secondary | ICD-10-CM | POA: Diagnosis not present

## 2014-10-11 DIAGNOSIS — N186 End stage renal disease: Secondary | ICD-10-CM | POA: Diagnosis not present

## 2014-10-11 DIAGNOSIS — N2581 Secondary hyperparathyroidism of renal origin: Secondary | ICD-10-CM | POA: Diagnosis not present

## 2014-10-11 DIAGNOSIS — D509 Iron deficiency anemia, unspecified: Secondary | ICD-10-CM | POA: Diagnosis not present

## 2014-10-12 DIAGNOSIS — N186 End stage renal disease: Secondary | ICD-10-CM | POA: Diagnosis not present

## 2014-10-12 DIAGNOSIS — Z992 Dependence on renal dialysis: Secondary | ICD-10-CM | POA: Diagnosis not present

## 2014-10-14 DIAGNOSIS — N186 End stage renal disease: Secondary | ICD-10-CM | POA: Diagnosis not present

## 2014-10-14 DIAGNOSIS — D631 Anemia in chronic kidney disease: Secondary | ICD-10-CM | POA: Diagnosis not present

## 2014-10-14 DIAGNOSIS — N2581 Secondary hyperparathyroidism of renal origin: Secondary | ICD-10-CM | POA: Diagnosis not present

## 2014-10-14 DIAGNOSIS — D509 Iron deficiency anemia, unspecified: Secondary | ICD-10-CM | POA: Diagnosis not present

## 2014-10-16 DIAGNOSIS — D509 Iron deficiency anemia, unspecified: Secondary | ICD-10-CM | POA: Diagnosis not present

## 2014-10-16 DIAGNOSIS — D631 Anemia in chronic kidney disease: Secondary | ICD-10-CM | POA: Diagnosis not present

## 2014-10-16 DIAGNOSIS — N186 End stage renal disease: Secondary | ICD-10-CM | POA: Diagnosis not present

## 2014-10-16 DIAGNOSIS — N2581 Secondary hyperparathyroidism of renal origin: Secondary | ICD-10-CM | POA: Diagnosis not present

## 2014-10-18 DIAGNOSIS — D509 Iron deficiency anemia, unspecified: Secondary | ICD-10-CM | POA: Diagnosis not present

## 2014-10-18 DIAGNOSIS — D631 Anemia in chronic kidney disease: Secondary | ICD-10-CM | POA: Diagnosis not present

## 2014-10-18 DIAGNOSIS — N2581 Secondary hyperparathyroidism of renal origin: Secondary | ICD-10-CM | POA: Diagnosis not present

## 2014-10-18 DIAGNOSIS — N186 End stage renal disease: Secondary | ICD-10-CM | POA: Diagnosis not present

## 2014-10-21 DIAGNOSIS — N2581 Secondary hyperparathyroidism of renal origin: Secondary | ICD-10-CM | POA: Diagnosis not present

## 2014-10-21 DIAGNOSIS — D509 Iron deficiency anemia, unspecified: Secondary | ICD-10-CM | POA: Diagnosis not present

## 2014-10-21 DIAGNOSIS — N186 End stage renal disease: Secondary | ICD-10-CM | POA: Diagnosis not present

## 2014-10-21 DIAGNOSIS — D631 Anemia in chronic kidney disease: Secondary | ICD-10-CM | POA: Diagnosis not present

## 2014-10-23 DIAGNOSIS — D631 Anemia in chronic kidney disease: Secondary | ICD-10-CM | POA: Diagnosis not present

## 2014-10-23 DIAGNOSIS — N2581 Secondary hyperparathyroidism of renal origin: Secondary | ICD-10-CM | POA: Diagnosis not present

## 2014-10-23 DIAGNOSIS — D509 Iron deficiency anemia, unspecified: Secondary | ICD-10-CM | POA: Diagnosis not present

## 2014-10-23 DIAGNOSIS — N186 End stage renal disease: Secondary | ICD-10-CM | POA: Diagnosis not present

## 2014-10-25 DIAGNOSIS — D631 Anemia in chronic kidney disease: Secondary | ICD-10-CM | POA: Diagnosis not present

## 2014-10-25 DIAGNOSIS — D509 Iron deficiency anemia, unspecified: Secondary | ICD-10-CM | POA: Diagnosis not present

## 2014-10-25 DIAGNOSIS — N2581 Secondary hyperparathyroidism of renal origin: Secondary | ICD-10-CM | POA: Diagnosis not present

## 2014-10-25 DIAGNOSIS — N186 End stage renal disease: Secondary | ICD-10-CM | POA: Diagnosis not present

## 2014-10-28 DIAGNOSIS — N186 End stage renal disease: Secondary | ICD-10-CM | POA: Diagnosis not present

## 2014-10-28 DIAGNOSIS — N2581 Secondary hyperparathyroidism of renal origin: Secondary | ICD-10-CM | POA: Diagnosis not present

## 2014-10-28 DIAGNOSIS — D631 Anemia in chronic kidney disease: Secondary | ICD-10-CM | POA: Diagnosis not present

## 2014-10-28 DIAGNOSIS — D509 Iron deficiency anemia, unspecified: Secondary | ICD-10-CM | POA: Diagnosis not present

## 2014-10-30 DIAGNOSIS — D631 Anemia in chronic kidney disease: Secondary | ICD-10-CM | POA: Diagnosis not present

## 2014-10-30 DIAGNOSIS — D509 Iron deficiency anemia, unspecified: Secondary | ICD-10-CM | POA: Diagnosis not present

## 2014-10-30 DIAGNOSIS — N2581 Secondary hyperparathyroidism of renal origin: Secondary | ICD-10-CM | POA: Diagnosis not present

## 2014-10-30 DIAGNOSIS — N186 End stage renal disease: Secondary | ICD-10-CM | POA: Diagnosis not present

## 2014-11-01 DIAGNOSIS — N2581 Secondary hyperparathyroidism of renal origin: Secondary | ICD-10-CM | POA: Diagnosis not present

## 2014-11-01 DIAGNOSIS — D631 Anemia in chronic kidney disease: Secondary | ICD-10-CM | POA: Diagnosis not present

## 2014-11-01 DIAGNOSIS — D509 Iron deficiency anemia, unspecified: Secondary | ICD-10-CM | POA: Diagnosis not present

## 2014-11-01 DIAGNOSIS — N186 End stage renal disease: Secondary | ICD-10-CM | POA: Diagnosis not present

## 2014-11-04 DIAGNOSIS — N2581 Secondary hyperparathyroidism of renal origin: Secondary | ICD-10-CM | POA: Diagnosis not present

## 2014-11-04 DIAGNOSIS — D509 Iron deficiency anemia, unspecified: Secondary | ICD-10-CM | POA: Diagnosis not present

## 2014-11-04 DIAGNOSIS — D631 Anemia in chronic kidney disease: Secondary | ICD-10-CM | POA: Diagnosis not present

## 2014-11-04 DIAGNOSIS — N186 End stage renal disease: Secondary | ICD-10-CM | POA: Diagnosis not present

## 2014-11-06 DIAGNOSIS — D509 Iron deficiency anemia, unspecified: Secondary | ICD-10-CM | POA: Diagnosis not present

## 2014-11-06 DIAGNOSIS — N2581 Secondary hyperparathyroidism of renal origin: Secondary | ICD-10-CM | POA: Diagnosis not present

## 2014-11-06 DIAGNOSIS — N186 End stage renal disease: Secondary | ICD-10-CM | POA: Diagnosis not present

## 2014-11-06 DIAGNOSIS — D631 Anemia in chronic kidney disease: Secondary | ICD-10-CM | POA: Diagnosis not present

## 2014-11-08 DIAGNOSIS — N186 End stage renal disease: Secondary | ICD-10-CM | POA: Diagnosis not present

## 2014-11-10 DIAGNOSIS — N186 End stage renal disease: Secondary | ICD-10-CM | POA: Diagnosis not present

## 2014-11-10 DIAGNOSIS — Z992 Dependence on renal dialysis: Secondary | ICD-10-CM | POA: Diagnosis not present

## 2014-11-11 DIAGNOSIS — D509 Iron deficiency anemia, unspecified: Secondary | ICD-10-CM | POA: Diagnosis not present

## 2014-11-11 DIAGNOSIS — N186 End stage renal disease: Secondary | ICD-10-CM | POA: Diagnosis not present

## 2014-11-11 DIAGNOSIS — D631 Anemia in chronic kidney disease: Secondary | ICD-10-CM | POA: Diagnosis not present

## 2014-11-11 DIAGNOSIS — N2581 Secondary hyperparathyroidism of renal origin: Secondary | ICD-10-CM | POA: Diagnosis not present

## 2014-11-18 ENCOUNTER — Other Ambulatory Visit: Payer: Self-pay | Admitting: *Deleted

## 2014-11-18 DIAGNOSIS — C8800 Waldenstrom macroglobulinemia not having achieved remission: Secondary | ICD-10-CM

## 2014-11-19 ENCOUNTER — Ambulatory Visit (HOSPITAL_BASED_OUTPATIENT_CLINIC_OR_DEPARTMENT_OTHER): Payer: Medicare Other | Admitting: Hematology & Oncology

## 2014-11-19 ENCOUNTER — Ambulatory Visit: Payer: Medicare Other

## 2014-11-19 ENCOUNTER — Other Ambulatory Visit (HOSPITAL_BASED_OUTPATIENT_CLINIC_OR_DEPARTMENT_OTHER): Payer: Medicare Other | Admitting: Lab

## 2014-11-19 ENCOUNTER — Encounter: Payer: Self-pay | Admitting: Hematology & Oncology

## 2014-11-19 VITALS — BP 131/76 | HR 86 | Temp 98.4°F | Resp 14 | Ht 66.0 in | Wt 147.0 lb

## 2014-11-19 DIAGNOSIS — C8800 Waldenstrom macroglobulinemia not having achieved remission: Secondary | ICD-10-CM

## 2014-11-19 DIAGNOSIS — Z992 Dependence on renal dialysis: Secondary | ICD-10-CM | POA: Diagnosis not present

## 2014-11-19 DIAGNOSIS — N189 Chronic kidney disease, unspecified: Secondary | ICD-10-CM | POA: Diagnosis not present

## 2014-11-19 LAB — CBC WITH DIFFERENTIAL (CANCER CENTER ONLY)
BASO#: 0 10*3/uL (ref 0.0–0.2)
BASO%: 0.6 % (ref 0.0–2.0)
EOS ABS: 0.2 10*3/uL (ref 0.0–0.5)
EOS%: 2.2 % (ref 0.0–7.0)
HCT: 36.5 % (ref 34.8–46.6)
HGB: 12 g/dL (ref 11.6–15.9)
LYMPH#: 2.9 10*3/uL (ref 0.9–3.3)
LYMPH%: 40.6 % (ref 14.0–48.0)
MCH: 30.4 pg (ref 26.0–34.0)
MCHC: 32.9 g/dL (ref 32.0–36.0)
MCV: 92 fL (ref 81–101)
MONO#: 0.7 10*3/uL (ref 0.1–0.9)
MONO%: 9.1 % (ref 0.0–13.0)
NEUT%: 47.5 % (ref 39.6–80.0)
NEUTROS ABS: 3.4 10*3/uL (ref 1.5–6.5)
Platelets: 346 10*3/uL (ref 145–400)
RBC: 3.95 10*6/uL (ref 3.70–5.32)
RDW: 15.6 % (ref 11.1–15.7)
WBC: 7.2 10*3/uL (ref 3.9–10.0)

## 2014-11-21 ENCOUNTER — Telehealth: Payer: Self-pay | Admitting: *Deleted

## 2014-11-21 LAB — COMPREHENSIVE METABOLIC PANEL
ALK PHOS: 58 U/L (ref 39–117)
ALT: 9 U/L (ref 0–35)
AST: 16 U/L (ref 0–37)
Albumin: 3.5 g/dL (ref 3.5–5.2)
BILIRUBIN TOTAL: 0.6 mg/dL (ref 0.2–1.2)
BUN: 15 mg/dL (ref 6–23)
CALCIUM: 8.4 mg/dL (ref 8.4–10.5)
CHLORIDE: 97 meq/L (ref 96–112)
CO2: 32 mEq/L (ref 19–32)
Creatinine, Ser: 4.77 mg/dL — ABNORMAL HIGH (ref 0.50–1.10)
Glucose, Bld: 72 mg/dL (ref 70–99)
POTASSIUM: 3.6 meq/L (ref 3.5–5.3)
Sodium: 141 mEq/L (ref 135–145)
Total Protein: 6.8 g/dL (ref 6.0–8.3)

## 2014-11-21 LAB — PROTEIN ELECTROPHORESIS, SERUM
ALPHA-1-GLOBULIN: 5.4 % — AB (ref 2.9–4.9)
ALPHA-2-GLOBULIN: 9.8 % (ref 7.1–11.8)
Albumin ELP: 52.5 % — ABNORMAL LOW (ref 55.8–66.1)
BETA 2: 5.5 % (ref 3.2–6.5)
Beta Globulin: 4.7 % (ref 4.7–7.2)
Gamma Globulin: 22.1 % — ABNORMAL HIGH (ref 11.1–18.8)
M-Spike, %: 0.73 g/dL
Total Protein, Serum Electrophoresis: 6.8 g/dL (ref 6.0–8.3)

## 2014-11-21 LAB — IFE INTERPRETATION

## 2014-11-21 LAB — KAPPA/LAMBDA LIGHT CHAINS
KAPPA FREE LGHT CHN: 17.4 mg/dL — AB (ref 0.33–1.94)
KAPPA LAMBDA RATIO: 0.96 (ref 0.26–1.65)
Lambda Free Lght Chn: 18.2 mg/dL — ABNORMAL HIGH (ref 0.57–2.63)

## 2014-11-21 LAB — IGG, IGA, IGM
IgA: 68 mg/dL — ABNORMAL LOW (ref 69–380)
IgG (Immunoglobin G), Serum: 951 mg/dL (ref 690–1700)
IgM, Serum: 1190 mg/dL — ABNORMAL HIGH (ref 52–322)

## 2014-11-21 NOTE — Progress Notes (Signed)
Hematology and Oncology Follow Up Visit  Shannon Murray [id number] [birth year] 69 y.o. 11/21/2014   Principle Diagnosis:   Waldenstrm's macroglobulinemia  Chronic renal failure-hemodialysis  Current Therapy:    Observation     Interim History:  Ms.  Steinberger is back for followup. Dialysis tends to tire her out. She has some occasional abdominal discomfort. Where she had this abscess in the left lower quadrant of her abdomen excised, she has some occasional pain.  Otherwise, she is doing pretty well. She got through all the holidays okay. She'll be going up to [place] for a niece's college graduation.  She's had no problems with the Waldenstrm's. Her last monoclonal spike with IgM was 1250 mg/dL. Her kappa light chain was 18.9 mg/dL. Again these are holding very stable.  She's had no nausea vomiting. She's had no cough. She's had no fever. She's had no rashes. She's had no leg swelling.  Overall, her performance status is ECOG 1-2.  Medications:  Current outpatient prescriptions:  .  b complex-vitamin c-folic acid (NEPHRO-VITE) 0.8 MG TABS tablet, Take 1 tablet by mouth at bedtime., Disp: , Rfl:  .  Cetirizine HCl (ZYRTEC ALLERGY PO), Take by mouth every morning., Disp: , Rfl:  .  DULERA 200-5 MCG/ACT AERO, Inhale 2 puffs into the lungs 2 (two) times daily. , Disp: , Rfl:  .  RENVELA 800 MG tablet, Take by mouth 3 (three) times daily with meals. , Disp: , Rfl:   Allergies:  Allergies  Allergen Reactions  . Ciprofloxacin Itching  . Diphenhydramine Hcl Itching    Past Medical History, Surgical history, Social history, and Family History were reviewed and updated.  Review of Systems: As above  Physical Exam:  height is 5\' 6"  (1.676 m) and weight is 147 lb (66.679 kg). Her oral temperature is 98.4 F (36.9 C). Her blood pressure is 131/76 and her pulse is 86. Her respiration is 14.   Thin but well-nourished African American female. Head and neck exam shows no  ocular or oral lesions. She is no palpable cervical or supraclavicular lymph nodes. Lungs are clear. She does arouse, wheezes or rhonchi. Cardiac exam regular rate and rhythm with a 1/6 systolic murmur. Abdomen is soft. The abscess site in her left lower abdomen is healed. There is still a little bit  of firmness and hyper pigmentation noted at the abscess surgical site. No palpable liver or spleen tip is noted. Back exam shows no tenderness over the spine, ribs or hips. Extremities shows no clubbing, cyanosis or edema. She has good strength in her muscles. She has good range of motion of her joints. Skin exam shows no rashes, ecchymosis or petechia. Neurological exam is nonfocal.  Lab Results  Component Value Date   WBC 7.2 11/19/2014   HGB 12.0 11/19/2014   HCT 36.5 11/19/2014   MCV 92 11/19/2014   PLT 346 11/19/2014     Chemistry      Component Value Date/Time   NA 141 11/19/2014 1157   NA 142 08/20/2014 1319   K 3.6 11/19/2014 1157   K 3.8 08/20/2014 1319   CL 97 11/19/2014 1157   CL 94* 08/20/2014 1319   CO2 32 11/19/2014 1157   CO2 32 08/20/2014 1319   BUN 15 11/19/2014 1157   BUN 27* 08/20/2014 1319   CREATININE 4.77* 11/19/2014 1157   CREATININE 5.4* 08/20/2014 1319      Component Value Date/Time   CALCIUM 8.4 11/19/2014 1157   CALCIUM 9.1 08/20/2014 1319  ALKPHOS 58 11/19/2014 1157   ALKPHOS 84 08/20/2014 1319   AST 16 11/19/2014 1157   AST 23 08/20/2014 1319   ALT 9 11/19/2014 1157   ALT 11 08/20/2014 1319   BILITOT 0.6 11/19/2014 1157   BILITOT 0.50 08/20/2014 1319         Impression and Plan: Ms. Heatley is 25- year-old [place] American female. She has Waldenstrm's macroglobulinemia. She is being watched right now. She does not need to be any kind of therapy. Her labs are looking pretty stable. Her IgM level is 1190 mg/dL. Her Kappa Light chain is 17.4 mg/dL. Again these are all very stable.  I will plan to get her back in another 3 months.  She will  be going up to [place] for a niece's college graduation. I don't see any problems with her going.   Volanda Napoleon, MD 3/11/20161:42 PM

## 2014-11-21 NOTE — Telephone Encounter (Addendum)
Patient aware of results  ----- Message from Volanda Napoleon, MD sent at 11/21/2014  8:58 AM EST ----- Call -- labs for waldenstrom's look stable!!  Shannon Murray

## 2014-11-26 DIAGNOSIS — R413 Other amnesia: Secondary | ICD-10-CM | POA: Diagnosis not present

## 2014-11-26 DIAGNOSIS — E780 Pure hypercholesterolemia: Secondary | ICD-10-CM | POA: Diagnosis not present

## 2014-11-26 DIAGNOSIS — C880 Waldenstrom macroglobulinemia: Secondary | ICD-10-CM | POA: Diagnosis not present

## 2014-11-26 DIAGNOSIS — N289 Disorder of kidney and ureter, unspecified: Secondary | ICD-10-CM | POA: Diagnosis not present

## 2014-11-26 DIAGNOSIS — E559 Vitamin D deficiency, unspecified: Secondary | ICD-10-CM | POA: Diagnosis not present

## 2014-11-26 DIAGNOSIS — J4520 Mild intermittent asthma, uncomplicated: Secondary | ICD-10-CM | POA: Diagnosis not present

## 2014-11-26 DIAGNOSIS — Z0000 Encounter for general adult medical examination without abnormal findings: Secondary | ICD-10-CM | POA: Diagnosis not present

## 2014-11-26 DIAGNOSIS — Z79899 Other long term (current) drug therapy: Secondary | ICD-10-CM | POA: Diagnosis not present

## 2014-12-11 DIAGNOSIS — Z992 Dependence on renal dialysis: Secondary | ICD-10-CM | POA: Diagnosis not present

## 2014-12-11 DIAGNOSIS — R69 Illness, unspecified: Secondary | ICD-10-CM | POA: Diagnosis not present

## 2014-12-11 DIAGNOSIS — N186 End stage renal disease: Secondary | ICD-10-CM | POA: Diagnosis not present

## 2014-12-13 DIAGNOSIS — D509 Iron deficiency anemia, unspecified: Secondary | ICD-10-CM | POA: Diagnosis not present

## 2014-12-13 DIAGNOSIS — N2581 Secondary hyperparathyroidism of renal origin: Secondary | ICD-10-CM | POA: Diagnosis not present

## 2014-12-13 DIAGNOSIS — N186 End stage renal disease: Secondary | ICD-10-CM | POA: Diagnosis not present

## 2014-12-13 DIAGNOSIS — Z23 Encounter for immunization: Secondary | ICD-10-CM | POA: Diagnosis not present

## 2014-12-16 DIAGNOSIS — Z23 Encounter for immunization: Secondary | ICD-10-CM | POA: Diagnosis not present

## 2014-12-16 DIAGNOSIS — N186 End stage renal disease: Secondary | ICD-10-CM | POA: Diagnosis not present

## 2014-12-16 DIAGNOSIS — D509 Iron deficiency anemia, unspecified: Secondary | ICD-10-CM | POA: Diagnosis not present

## 2014-12-16 DIAGNOSIS — N2581 Secondary hyperparathyroidism of renal origin: Secondary | ICD-10-CM | POA: Diagnosis not present

## 2014-12-18 DIAGNOSIS — Z23 Encounter for immunization: Secondary | ICD-10-CM | POA: Diagnosis not present

## 2014-12-18 DIAGNOSIS — D509 Iron deficiency anemia, unspecified: Secondary | ICD-10-CM | POA: Diagnosis not present

## 2014-12-18 DIAGNOSIS — N186 End stage renal disease: Secondary | ICD-10-CM | POA: Diagnosis not present

## 2014-12-18 DIAGNOSIS — N2581 Secondary hyperparathyroidism of renal origin: Secondary | ICD-10-CM | POA: Diagnosis not present

## 2014-12-20 DIAGNOSIS — N2581 Secondary hyperparathyroidism of renal origin: Secondary | ICD-10-CM | POA: Diagnosis not present

## 2014-12-20 DIAGNOSIS — Z23 Encounter for immunization: Secondary | ICD-10-CM | POA: Diagnosis not present

## 2014-12-20 DIAGNOSIS — N186 End stage renal disease: Secondary | ICD-10-CM | POA: Diagnosis not present

## 2014-12-20 DIAGNOSIS — D509 Iron deficiency anemia, unspecified: Secondary | ICD-10-CM | POA: Diagnosis not present

## 2014-12-20 DIAGNOSIS — R5383 Other fatigue: Secondary | ICD-10-CM | POA: Diagnosis not present

## 2014-12-23 DIAGNOSIS — Z23 Encounter for immunization: Secondary | ICD-10-CM | POA: Diagnosis not present

## 2014-12-23 DIAGNOSIS — N2581 Secondary hyperparathyroidism of renal origin: Secondary | ICD-10-CM | POA: Diagnosis not present

## 2014-12-23 DIAGNOSIS — N186 End stage renal disease: Secondary | ICD-10-CM | POA: Diagnosis not present

## 2014-12-23 DIAGNOSIS — D509 Iron deficiency anemia, unspecified: Secondary | ICD-10-CM | POA: Diagnosis not present

## 2014-12-25 DIAGNOSIS — N2581 Secondary hyperparathyroidism of renal origin: Secondary | ICD-10-CM | POA: Diagnosis not present

## 2014-12-25 DIAGNOSIS — N186 End stage renal disease: Secondary | ICD-10-CM | POA: Diagnosis not present

## 2014-12-25 DIAGNOSIS — D509 Iron deficiency anemia, unspecified: Secondary | ICD-10-CM | POA: Diagnosis not present

## 2014-12-25 DIAGNOSIS — Z23 Encounter for immunization: Secondary | ICD-10-CM | POA: Diagnosis not present

## 2014-12-27 DIAGNOSIS — D509 Iron deficiency anemia, unspecified: Secondary | ICD-10-CM | POA: Diagnosis not present

## 2014-12-27 DIAGNOSIS — N186 End stage renal disease: Secondary | ICD-10-CM | POA: Diagnosis not present

## 2014-12-27 DIAGNOSIS — Z23 Encounter for immunization: Secondary | ICD-10-CM | POA: Diagnosis not present

## 2014-12-27 DIAGNOSIS — N2581 Secondary hyperparathyroidism of renal origin: Secondary | ICD-10-CM | POA: Diagnosis not present

## 2014-12-30 DIAGNOSIS — D509 Iron deficiency anemia, unspecified: Secondary | ICD-10-CM | POA: Diagnosis not present

## 2014-12-30 DIAGNOSIS — Z23 Encounter for immunization: Secondary | ICD-10-CM | POA: Diagnosis not present

## 2014-12-30 DIAGNOSIS — N2581 Secondary hyperparathyroidism of renal origin: Secondary | ICD-10-CM | POA: Diagnosis not present

## 2014-12-30 DIAGNOSIS — N186 End stage renal disease: Secondary | ICD-10-CM | POA: Diagnosis not present

## 2015-01-01 DIAGNOSIS — N2581 Secondary hyperparathyroidism of renal origin: Secondary | ICD-10-CM | POA: Diagnosis not present

## 2015-01-01 DIAGNOSIS — Z23 Encounter for immunization: Secondary | ICD-10-CM | POA: Diagnosis not present

## 2015-01-01 DIAGNOSIS — N186 End stage renal disease: Secondary | ICD-10-CM | POA: Diagnosis not present

## 2015-01-01 DIAGNOSIS — D509 Iron deficiency anemia, unspecified: Secondary | ICD-10-CM | POA: Diagnosis not present

## 2015-01-03 DIAGNOSIS — Z23 Encounter for immunization: Secondary | ICD-10-CM | POA: Diagnosis not present

## 2015-01-03 DIAGNOSIS — N2581 Secondary hyperparathyroidism of renal origin: Secondary | ICD-10-CM | POA: Diagnosis not present

## 2015-01-03 DIAGNOSIS — D509 Iron deficiency anemia, unspecified: Secondary | ICD-10-CM | POA: Diagnosis not present

## 2015-01-03 DIAGNOSIS — N186 End stage renal disease: Secondary | ICD-10-CM | POA: Diagnosis not present

## 2015-01-06 DIAGNOSIS — D509 Iron deficiency anemia, unspecified: Secondary | ICD-10-CM | POA: Diagnosis not present

## 2015-01-06 DIAGNOSIS — N2581 Secondary hyperparathyroidism of renal origin: Secondary | ICD-10-CM | POA: Diagnosis not present

## 2015-01-06 DIAGNOSIS — Z23 Encounter for immunization: Secondary | ICD-10-CM | POA: Diagnosis not present

## 2015-01-06 DIAGNOSIS — N186 End stage renal disease: Secondary | ICD-10-CM | POA: Diagnosis not present

## 2015-01-08 DIAGNOSIS — N186 End stage renal disease: Secondary | ICD-10-CM | POA: Diagnosis not present

## 2015-01-08 DIAGNOSIS — N2581 Secondary hyperparathyroidism of renal origin: Secondary | ICD-10-CM | POA: Diagnosis not present

## 2015-01-08 DIAGNOSIS — Z23 Encounter for immunization: Secondary | ICD-10-CM | POA: Diagnosis not present

## 2015-01-08 DIAGNOSIS — D509 Iron deficiency anemia, unspecified: Secondary | ICD-10-CM | POA: Diagnosis not present

## 2015-01-10 DIAGNOSIS — N186 End stage renal disease: Secondary | ICD-10-CM | POA: Diagnosis not present

## 2015-01-10 DIAGNOSIS — N2581 Secondary hyperparathyroidism of renal origin: Secondary | ICD-10-CM | POA: Diagnosis not present

## 2015-01-10 DIAGNOSIS — Z992 Dependence on renal dialysis: Secondary | ICD-10-CM | POA: Diagnosis not present

## 2015-01-10 DIAGNOSIS — R69 Illness, unspecified: Secondary | ICD-10-CM | POA: Diagnosis not present

## 2015-01-10 DIAGNOSIS — Z23 Encounter for immunization: Secondary | ICD-10-CM | POA: Diagnosis not present

## 2015-01-10 DIAGNOSIS — D509 Iron deficiency anemia, unspecified: Secondary | ICD-10-CM | POA: Diagnosis not present

## 2015-01-13 DIAGNOSIS — N2581 Secondary hyperparathyroidism of renal origin: Secondary | ICD-10-CM | POA: Diagnosis not present

## 2015-01-13 DIAGNOSIS — N186 End stage renal disease: Secondary | ICD-10-CM | POA: Diagnosis not present

## 2015-01-20 DIAGNOSIS — N186 End stage renal disease: Secondary | ICD-10-CM | POA: Diagnosis not present

## 2015-01-20 DIAGNOSIS — D631 Anemia in chronic kidney disease: Secondary | ICD-10-CM | POA: Diagnosis not present

## 2015-01-22 DIAGNOSIS — N186 End stage renal disease: Secondary | ICD-10-CM | POA: Diagnosis not present

## 2015-01-22 DIAGNOSIS — D631 Anemia in chronic kidney disease: Secondary | ICD-10-CM | POA: Diagnosis not present

## 2015-01-24 DIAGNOSIS — N186 End stage renal disease: Secondary | ICD-10-CM | POA: Diagnosis not present

## 2015-01-24 DIAGNOSIS — D631 Anemia in chronic kidney disease: Secondary | ICD-10-CM | POA: Diagnosis not present

## 2015-02-10 DIAGNOSIS — Z992 Dependence on renal dialysis: Secondary | ICD-10-CM | POA: Diagnosis not present

## 2015-02-10 DIAGNOSIS — N186 End stage renal disease: Secondary | ICD-10-CM | POA: Diagnosis not present

## 2015-02-10 DIAGNOSIS — R69 Illness, unspecified: Secondary | ICD-10-CM | POA: Diagnosis not present

## 2015-02-12 DIAGNOSIS — N2581 Secondary hyperparathyroidism of renal origin: Secondary | ICD-10-CM | POA: Diagnosis not present

## 2015-02-12 DIAGNOSIS — N186 End stage renal disease: Secondary | ICD-10-CM | POA: Diagnosis not present

## 2015-02-18 ENCOUNTER — Ambulatory Visit (HOSPITAL_BASED_OUTPATIENT_CLINIC_OR_DEPARTMENT_OTHER): Payer: Medicare Other

## 2015-02-18 ENCOUNTER — Telehealth: Payer: Self-pay | Admitting: *Deleted

## 2015-02-18 ENCOUNTER — Encounter: Payer: Self-pay | Admitting: Family

## 2015-02-18 ENCOUNTER — Ambulatory Visit (HOSPITAL_BASED_OUTPATIENT_CLINIC_OR_DEPARTMENT_OTHER): Payer: Medicare Other | Admitting: Hematology & Oncology

## 2015-02-18 VITALS — BP 134/67 | HR 84 | Temp 97.8°F | Resp 18 | Wt 145.0 lb

## 2015-02-18 DIAGNOSIS — C880 Waldenstrom macroglobulinemia: Secondary | ICD-10-CM | POA: Diagnosis not present

## 2015-02-18 DIAGNOSIS — K117 Disturbances of salivary secretion: Secondary | ICD-10-CM

## 2015-02-18 DIAGNOSIS — N19 Unspecified kidney failure: Secondary | ICD-10-CM

## 2015-02-18 DIAGNOSIS — N189 Chronic kidney disease, unspecified: Secondary | ICD-10-CM | POA: Diagnosis not present

## 2015-02-18 DIAGNOSIS — R682 Dry mouth, unspecified: Principal | ICD-10-CM

## 2015-02-18 LAB — CMP (CANCER CENTER ONLY)
ALBUMIN: 3 g/dL — AB (ref 3.3–5.5)
ALK PHOS: 60 U/L (ref 26–84)
ALT: 14 U/L (ref 10–47)
AST: 25 U/L (ref 11–38)
BUN, Bld: 17 mg/dL (ref 7–22)
CALCIUM: 9.4 mg/dL (ref 8.0–10.3)
CO2: 33 mEq/L (ref 18–33)
Chloride: 95 mEq/L — ABNORMAL LOW (ref 98–108)
Creat: 5.3 mg/dl (ref 0.6–1.2)
Glucose, Bld: 102 mg/dL (ref 73–118)
Potassium: 3.5 mEq/L (ref 3.3–4.7)
Sodium: 140 mEq/L (ref 128–145)
Total Bilirubin: 0.8 mg/dl (ref 0.20–1.60)
Total Protein: 6.6 g/dL (ref 6.4–8.1)

## 2015-02-18 LAB — CBC WITH DIFFERENTIAL (CANCER CENTER ONLY)
BASO#: 0.1 10*3/uL (ref 0.0–0.2)
BASO%: 0.7 % (ref 0.0–2.0)
EOS%: 2.6 % (ref 0.0–7.0)
Eosinophils Absolute: 0.2 10*3/uL (ref 0.0–0.5)
HCT: 32.4 % — ABNORMAL LOW (ref 34.8–46.6)
HEMOGLOBIN: 10.9 g/dL — AB (ref 11.6–15.9)
LYMPH#: 2.4 10*3/uL (ref 0.9–3.3)
LYMPH%: 33 % (ref 14.0–48.0)
MCH: 29.9 pg (ref 26.0–34.0)
MCHC: 33.6 g/dL (ref 32.0–36.0)
MCV: 89 fL (ref 81–101)
MONO#: 0.7 10*3/uL (ref 0.1–0.9)
MONO%: 9.2 % (ref 0.0–13.0)
NEUT%: 54.5 % (ref 39.6–80.0)
NEUTROS ABS: 4 10*3/uL (ref 1.5–6.5)
Platelets: 356 10*3/uL (ref 145–400)
RBC: 3.65 10*6/uL — ABNORMAL LOW (ref 3.70–5.32)
RDW: 15.3 % (ref 11.1–15.7)
WBC: 7.4 10*3/uL (ref 3.9–10.0)

## 2015-02-18 MED ORDER — PILOCARPINE HCL 5 MG PO TABS: 5.0000 mg | ORAL_TABLET | Freq: Two times a day (BID) | ORAL | Status: AC

## 2015-02-18 NOTE — Progress Notes (Signed)
Hematology and Oncology Follow Up Visit  Shannon Murray [id number] 1946-04-03 69 y.o. 02/18/2015   Principle Diagnosis:   Waldenstrm's macroglobulinemia  Chronic renal failure-hemodialysis  Current Therapy:    Observation     Interim History:  Shannon Murray is back for followup. Dialysis tends to tire her out. She has some occasional abdominal discomfort.   Her main complaint has been lack of appetite. Her weight is holding pretty steady when she was here last.  Her mouth is very dry she says. I will go ahead and give her some Salagen (5 mg by mouth twice a day) and see this helps.  Her monoclonal studies actually were good last time. We last saw her, her M spike was 0.70 g/dL. Her IgM level was 1190 mg/dL. Her lambda light chain was 18.2 mg/dL.  She has had a problem with fever. She had no infections. She has had no change in bowel habits. She still urinates despite the dialysis.  She probably will be going back up to [place] for part of the summer. There is a family reunion in August.  Overall, her performance status is ECOG 1-2.  Medications:  Current outpatient prescriptions:  .  b complex-vitamin c-folic acid (NEPHRO-VITE) 0.8 MG TABS tablet, Take 1 tablet by mouth at bedtime., Disp: , Rfl:  .  Cetirizine HCl (ZYRTEC ALLERGY PO), Take by mouth every morning., Disp: , Rfl:  .  DULERA 200-5 MCG/ACT AERO, Inhale 2 puffs into the lungs 2 (two) times daily. , Disp: , Rfl:  .  RENVELA 800 MG tablet, Take by mouth 3 (three) times daily with meals. , Disp: , Rfl:  .  pilocarpine (SALAGEN) 5 MG tablet, Take 1 tablet (5 mg total) by mouth 2 (two) times daily., Disp: 60 tablet, Rfl: 2  Allergies:  Allergies  Allergen Reactions  . Ciprofloxacin Itching  . Diphenhydramine Hcl Itching    Past Medical History, Surgical history, Social history, and Family History were reviewed and updated.  Review of Systems: As above  Physical Exam:  weight is 145 lb (65.772 kg). Her  temperature is 97.8 F (36.6 C). Her blood pressure is 134/67 and her pulse is 84. Her respiration is 18 and oxygen saturation is 98%.   Thin but well-nourished African American female. Head and neck exam shows no ocular or oral lesions. She is no palpable cervical or supraclavicular lymph nodes. Lungs are clear. She does arouse, wheezes or rhonchi. Cardiac exam regular rate and rhythm with a 1/6 systolic murmur. Abdomen is soft. The abscess site in her left lower abdomen is healed. There is still a little bit  of firmness and hyper pigmentation noted at the abscess surgical site. No palpable liver or spleen tip is noted. Back exam shows no tenderness over the spine, ribs or hips. Extremities shows no clubbing, cyanosis or edema. She has good strength in her muscles. She has good range of motion of her joints. Skin exam shows no rashes, ecchymosis or petechia. Neurological exam is nonfocal.  Lab Results  Component Value Date   WBC 7.4 02/18/2015   HGB 10.9* 02/18/2015   HCT 32.4* 02/18/2015   MCV 89 02/18/2015   PLT 356 02/18/2015     Chemistry      Component Value Date/Time   NA 140 02/18/2015 1053   NA 141 11/19/2014 1157   K 3.5 02/18/2015 1053   K 3.6 11/19/2014 1157   CL 95* 02/18/2015 1053   CL 97 11/19/2014 1157   CO2 33  02/18/2015 1053   CO2 32 11/19/2014 1157   BUN 17 02/18/2015 1053   BUN 15 11/19/2014 1157   CREATININE 5.3* 02/18/2015 1053   CREATININE 4.77* 11/19/2014 1157      Component Value Date/Time   CALCIUM 9.4 02/18/2015 1053   CALCIUM 8.4 11/19/2014 1157   ALKPHOS 60 02/18/2015 1053   ALKPHOS 58 11/19/2014 1157   AST 25 02/18/2015 1053   AST 16 11/19/2014 1157   ALT 14 02/18/2015 1053   ALT 9 11/19/2014 1157   BILITOT 0.80 02/18/2015 1053   BILITOT 0.6 11/19/2014 1157         Impression and Plan: Shannon Murray is 37- year-old [place] American female. She has Waldenstrm's macroglobulinemia. She is being watched right now. She does not need to be any  kind of therapy. So far, her monoclonal studies have all looked pretty stable. That is nice to see.  For now, I think we'll probably get her back in 4 months. We'll get her back after Labor Day.  Hopefully, the Salagen will help with her appetite.  We will be more than happy to see her back in between if necessary.   Volanda Napoleon, MD 6/8/201612:26 PM

## 2015-02-18 NOTE — Telephone Encounter (Signed)
Critical Value 5.3 Dr Marin Olp aware. No order received.

## 2015-02-24 LAB — PROTEIN ELECTROPHORESIS, SERUM, WITH REFLEX
ABNORMAL PROTEIN BAND1: 0.9 g/dL
ALBUMIN ELP: 3.2 g/dL — AB (ref 3.8–4.8)
Alpha-1-Globulin: 0.4 g/dL — ABNORMAL HIGH (ref 0.2–0.3)
Alpha-2-Globulin: 0.8 g/dL (ref 0.5–0.9)
BETA 2: 0.4 g/dL (ref 0.2–0.5)
BETA GLOBULIN: 0.3 g/dL — AB (ref 0.4–0.6)
Gamma Globulin: 1.5 g/dL (ref 0.8–1.7)
Total Protein, Serum Electrophoresis: 6.6 g/dL (ref 6.1–8.1)

## 2015-02-24 LAB — KAPPA/LAMBDA LIGHT CHAINS
KAPPA FREE LGHT CHN: 17.3 mg/dL — AB (ref 0.33–1.94)
Kappa:Lambda Ratio: 0.97 (ref 0.26–1.65)
Lambda Free Lght Chn: 17.9 mg/dL — ABNORMAL HIGH (ref 0.57–2.63)

## 2015-02-24 LAB — IGG, IGA, IGM
IgA: 63 mg/dL — ABNORMAL LOW (ref 69–380)
IgG (Immunoglobin G), Serum: 867 mg/dL (ref 690–1700)
IgM, Serum: 1290 mg/dL — ABNORMAL HIGH (ref 52–322)

## 2015-02-24 LAB — IFE INTERPRETATION

## 2015-02-25 ENCOUNTER — Telehealth: Payer: Self-pay | Admitting: *Deleted

## 2015-02-25 NOTE — Telephone Encounter (Signed)
-----   Message from Volanda Napoleon, MD sent at 02/24/2015 12:27 PM EDT ----- Please call and tell her that the protein levels for IgM are pretty stable. Thanks

## 2015-03-12 DIAGNOSIS — R69 Illness, unspecified: Secondary | ICD-10-CM | POA: Diagnosis not present

## 2015-03-12 DIAGNOSIS — Z992 Dependence on renal dialysis: Secondary | ICD-10-CM | POA: Diagnosis not present

## 2015-03-12 DIAGNOSIS — N186 End stage renal disease: Secondary | ICD-10-CM | POA: Diagnosis not present

## 2015-03-13 DIAGNOSIS — N186 End stage renal disease: Secondary | ICD-10-CM | POA: Diagnosis not present

## 2015-03-14 DIAGNOSIS — N2581 Secondary hyperparathyroidism of renal origin: Secondary | ICD-10-CM | POA: Diagnosis not present

## 2015-03-14 DIAGNOSIS — N186 End stage renal disease: Secondary | ICD-10-CM | POA: Diagnosis not present

## 2015-03-14 DIAGNOSIS — D631 Anemia in chronic kidney disease: Secondary | ICD-10-CM | POA: Diagnosis not present

## 2015-03-17 DIAGNOSIS — D631 Anemia in chronic kidney disease: Secondary | ICD-10-CM | POA: Diagnosis not present

## 2015-03-17 DIAGNOSIS — N2581 Secondary hyperparathyroidism of renal origin: Secondary | ICD-10-CM | POA: Diagnosis not present

## 2015-03-17 DIAGNOSIS — N186 End stage renal disease: Secondary | ICD-10-CM | POA: Diagnosis not present

## 2015-03-19 DIAGNOSIS — D631 Anemia in chronic kidney disease: Secondary | ICD-10-CM | POA: Diagnosis not present

## 2015-03-19 DIAGNOSIS — N2581 Secondary hyperparathyroidism of renal origin: Secondary | ICD-10-CM | POA: Diagnosis not present

## 2015-03-19 DIAGNOSIS — N186 End stage renal disease: Secondary | ICD-10-CM | POA: Diagnosis not present

## 2015-03-21 DIAGNOSIS — D631 Anemia in chronic kidney disease: Secondary | ICD-10-CM | POA: Diagnosis not present

## 2015-03-21 DIAGNOSIS — N186 End stage renal disease: Secondary | ICD-10-CM | POA: Diagnosis not present

## 2015-03-21 DIAGNOSIS — N2581 Secondary hyperparathyroidism of renal origin: Secondary | ICD-10-CM | POA: Diagnosis not present

## 2015-03-24 DIAGNOSIS — N186 End stage renal disease: Secondary | ICD-10-CM | POA: Diagnosis not present

## 2015-03-24 DIAGNOSIS — D631 Anemia in chronic kidney disease: Secondary | ICD-10-CM | POA: Diagnosis not present

## 2015-03-24 DIAGNOSIS — N2581 Secondary hyperparathyroidism of renal origin: Secondary | ICD-10-CM | POA: Diagnosis not present

## 2015-03-26 DIAGNOSIS — N186 End stage renal disease: Secondary | ICD-10-CM | POA: Diagnosis not present

## 2015-03-26 DIAGNOSIS — N2581 Secondary hyperparathyroidism of renal origin: Secondary | ICD-10-CM | POA: Diagnosis not present

## 2015-03-26 DIAGNOSIS — D631 Anemia in chronic kidney disease: Secondary | ICD-10-CM | POA: Diagnosis not present

## 2015-03-28 DIAGNOSIS — N186 End stage renal disease: Secondary | ICD-10-CM | POA: Diagnosis not present

## 2015-03-28 DIAGNOSIS — N2581 Secondary hyperparathyroidism of renal origin: Secondary | ICD-10-CM | POA: Diagnosis not present

## 2015-03-28 DIAGNOSIS — D631 Anemia in chronic kidney disease: Secondary | ICD-10-CM | POA: Diagnosis not present

## 2015-03-31 DIAGNOSIS — N186 End stage renal disease: Secondary | ICD-10-CM | POA: Diagnosis not present

## 2015-03-31 DIAGNOSIS — D631 Anemia in chronic kidney disease: Secondary | ICD-10-CM | POA: Diagnosis not present

## 2015-03-31 DIAGNOSIS — N2581 Secondary hyperparathyroidism of renal origin: Secondary | ICD-10-CM | POA: Diagnosis not present

## 2015-04-02 DIAGNOSIS — N186 End stage renal disease: Secondary | ICD-10-CM | POA: Diagnosis not present

## 2015-04-02 DIAGNOSIS — D631 Anemia in chronic kidney disease: Secondary | ICD-10-CM | POA: Diagnosis not present

## 2015-04-02 DIAGNOSIS — N2581 Secondary hyperparathyroidism of renal origin: Secondary | ICD-10-CM | POA: Diagnosis not present

## 2015-04-04 DIAGNOSIS — N2581 Secondary hyperparathyroidism of renal origin: Secondary | ICD-10-CM | POA: Diagnosis not present

## 2015-04-04 DIAGNOSIS — D631 Anemia in chronic kidney disease: Secondary | ICD-10-CM | POA: Diagnosis not present

## 2015-04-04 DIAGNOSIS — N186 End stage renal disease: Secondary | ICD-10-CM | POA: Diagnosis not present

## 2015-04-07 DIAGNOSIS — D631 Anemia in chronic kidney disease: Secondary | ICD-10-CM | POA: Diagnosis not present

## 2015-04-07 DIAGNOSIS — N186 End stage renal disease: Secondary | ICD-10-CM | POA: Diagnosis not present

## 2015-04-07 DIAGNOSIS — N2581 Secondary hyperparathyroidism of renal origin: Secondary | ICD-10-CM | POA: Diagnosis not present

## 2015-04-09 DIAGNOSIS — N2581 Secondary hyperparathyroidism of renal origin: Secondary | ICD-10-CM | POA: Diagnosis not present

## 2015-04-09 DIAGNOSIS — D631 Anemia in chronic kidney disease: Secondary | ICD-10-CM | POA: Diagnosis not present

## 2015-04-09 DIAGNOSIS — N186 End stage renal disease: Secondary | ICD-10-CM | POA: Diagnosis not present

## 2015-04-11 DIAGNOSIS — N186 End stage renal disease: Secondary | ICD-10-CM | POA: Diagnosis not present

## 2015-04-11 DIAGNOSIS — N2581 Secondary hyperparathyroidism of renal origin: Secondary | ICD-10-CM | POA: Diagnosis not present

## 2015-04-11 DIAGNOSIS — D631 Anemia in chronic kidney disease: Secondary | ICD-10-CM | POA: Diagnosis not present

## 2015-04-12 DIAGNOSIS — R69 Illness, unspecified: Secondary | ICD-10-CM | POA: Diagnosis not present

## 2015-04-12 DIAGNOSIS — Z992 Dependence on renal dialysis: Secondary | ICD-10-CM | POA: Diagnosis not present

## 2015-04-12 DIAGNOSIS — N186 End stage renal disease: Secondary | ICD-10-CM | POA: Diagnosis not present

## 2015-04-14 DIAGNOSIS — N2581 Secondary hyperparathyroidism of renal origin: Secondary | ICD-10-CM | POA: Diagnosis not present

## 2015-04-14 DIAGNOSIS — N186 End stage renal disease: Secondary | ICD-10-CM | POA: Diagnosis not present

## 2015-04-14 DIAGNOSIS — D508 Other iron deficiency anemias: Secondary | ICD-10-CM | POA: Diagnosis not present

## 2015-04-21 DIAGNOSIS — N186 End stage renal disease: Secondary | ICD-10-CM | POA: Diagnosis not present

## 2015-04-21 DIAGNOSIS — D631 Anemia in chronic kidney disease: Secondary | ICD-10-CM | POA: Diagnosis not present

## 2015-04-23 DIAGNOSIS — N186 End stage renal disease: Secondary | ICD-10-CM | POA: Diagnosis not present

## 2015-04-23 DIAGNOSIS — D631 Anemia in chronic kidney disease: Secondary | ICD-10-CM | POA: Diagnosis not present

## 2015-04-25 DIAGNOSIS — D631 Anemia in chronic kidney disease: Secondary | ICD-10-CM | POA: Diagnosis not present

## 2015-04-25 DIAGNOSIS — N186 End stage renal disease: Secondary | ICD-10-CM | POA: Diagnosis not present

## 2015-04-28 DIAGNOSIS — N186 End stage renal disease: Secondary | ICD-10-CM | POA: Diagnosis not present

## 2015-04-28 DIAGNOSIS — D631 Anemia in chronic kidney disease: Secondary | ICD-10-CM | POA: Diagnosis not present

## 2015-04-30 DIAGNOSIS — N186 End stage renal disease: Secondary | ICD-10-CM | POA: Diagnosis not present

## 2015-04-30 DIAGNOSIS — D631 Anemia in chronic kidney disease: Secondary | ICD-10-CM | POA: Diagnosis not present

## 2015-05-02 DIAGNOSIS — N186 End stage renal disease: Secondary | ICD-10-CM | POA: Diagnosis not present

## 2015-05-02 DIAGNOSIS — D631 Anemia in chronic kidney disease: Secondary | ICD-10-CM | POA: Diagnosis not present

## 2015-05-11 ENCOUNTER — Other Ambulatory Visit: Payer: Self-pay

## 2015-05-11 DIAGNOSIS — Z1231 Encounter for screening mammogram for malignant neoplasm of breast: Secondary | ICD-10-CM

## 2015-05-13 DIAGNOSIS — N186 End stage renal disease: Secondary | ICD-10-CM | POA: Diagnosis not present

## 2015-05-13 DIAGNOSIS — Z992 Dependence on renal dialysis: Secondary | ICD-10-CM | POA: Diagnosis not present

## 2015-05-13 DIAGNOSIS — R69 Illness, unspecified: Secondary | ICD-10-CM | POA: Diagnosis not present

## 2015-05-14 DIAGNOSIS — D631 Anemia in chronic kidney disease: Secondary | ICD-10-CM | POA: Diagnosis not present

## 2015-05-14 DIAGNOSIS — D508 Other iron deficiency anemias: Secondary | ICD-10-CM | POA: Diagnosis not present

## 2015-05-14 DIAGNOSIS — N186 End stage renal disease: Secondary | ICD-10-CM | POA: Diagnosis not present

## 2015-05-15 DIAGNOSIS — H16223 Keratoconjunctivitis sicca, not specified as Sjogren's, bilateral: Secondary | ICD-10-CM | POA: Diagnosis not present

## 2015-05-15 DIAGNOSIS — H43813 Vitreous degeneration, bilateral: Secondary | ICD-10-CM | POA: Diagnosis not present

## 2015-05-16 DIAGNOSIS — D631 Anemia in chronic kidney disease: Secondary | ICD-10-CM | POA: Diagnosis not present

## 2015-05-16 DIAGNOSIS — D508 Other iron deficiency anemias: Secondary | ICD-10-CM | POA: Diagnosis not present

## 2015-05-16 DIAGNOSIS — N186 End stage renal disease: Secondary | ICD-10-CM | POA: Diagnosis not present

## 2015-05-19 DIAGNOSIS — N186 End stage renal disease: Secondary | ICD-10-CM | POA: Diagnosis not present

## 2015-05-19 DIAGNOSIS — D631 Anemia in chronic kidney disease: Secondary | ICD-10-CM | POA: Diagnosis not present

## 2015-05-19 DIAGNOSIS — D508 Other iron deficiency anemias: Secondary | ICD-10-CM | POA: Diagnosis not present

## 2015-05-21 DIAGNOSIS — D508 Other iron deficiency anemias: Secondary | ICD-10-CM | POA: Diagnosis not present

## 2015-05-21 DIAGNOSIS — N186 End stage renal disease: Secondary | ICD-10-CM | POA: Diagnosis not present

## 2015-05-21 DIAGNOSIS — D631 Anemia in chronic kidney disease: Secondary | ICD-10-CM | POA: Diagnosis not present

## 2015-05-23 DIAGNOSIS — D508 Other iron deficiency anemias: Secondary | ICD-10-CM | POA: Diagnosis not present

## 2015-05-23 DIAGNOSIS — D631 Anemia in chronic kidney disease: Secondary | ICD-10-CM | POA: Diagnosis not present

## 2015-05-23 DIAGNOSIS — N186 End stage renal disease: Secondary | ICD-10-CM | POA: Diagnosis not present

## 2015-05-26 DIAGNOSIS — N186 End stage renal disease: Secondary | ICD-10-CM | POA: Diagnosis not present

## 2015-05-26 DIAGNOSIS — D508 Other iron deficiency anemias: Secondary | ICD-10-CM | POA: Diagnosis not present

## 2015-05-26 DIAGNOSIS — D631 Anemia in chronic kidney disease: Secondary | ICD-10-CM | POA: Diagnosis not present

## 2015-05-28 DIAGNOSIS — D631 Anemia in chronic kidney disease: Secondary | ICD-10-CM | POA: Diagnosis not present

## 2015-05-28 DIAGNOSIS — N186 End stage renal disease: Secondary | ICD-10-CM | POA: Diagnosis not present

## 2015-05-28 DIAGNOSIS — D508 Other iron deficiency anemias: Secondary | ICD-10-CM | POA: Diagnosis not present

## 2015-05-30 DIAGNOSIS — D631 Anemia in chronic kidney disease: Secondary | ICD-10-CM | POA: Diagnosis not present

## 2015-05-30 DIAGNOSIS — D508 Other iron deficiency anemias: Secondary | ICD-10-CM | POA: Diagnosis not present

## 2015-05-30 DIAGNOSIS — N186 End stage renal disease: Secondary | ICD-10-CM | POA: Diagnosis not present

## 2015-06-02 DIAGNOSIS — N186 End stage renal disease: Secondary | ICD-10-CM | POA: Diagnosis not present

## 2015-06-02 DIAGNOSIS — D631 Anemia in chronic kidney disease: Secondary | ICD-10-CM | POA: Diagnosis not present

## 2015-06-02 DIAGNOSIS — D508 Other iron deficiency anemias: Secondary | ICD-10-CM | POA: Diagnosis not present

## 2015-06-04 DIAGNOSIS — D631 Anemia in chronic kidney disease: Secondary | ICD-10-CM | POA: Diagnosis not present

## 2015-06-04 DIAGNOSIS — D508 Other iron deficiency anemias: Secondary | ICD-10-CM | POA: Diagnosis not present

## 2015-06-04 DIAGNOSIS — N186 End stage renal disease: Secondary | ICD-10-CM | POA: Diagnosis not present

## 2015-06-06 DIAGNOSIS — D631 Anemia in chronic kidney disease: Secondary | ICD-10-CM | POA: Diagnosis not present

## 2015-06-06 DIAGNOSIS — N186 End stage renal disease: Secondary | ICD-10-CM | POA: Diagnosis not present

## 2015-06-06 DIAGNOSIS — D508 Other iron deficiency anemias: Secondary | ICD-10-CM | POA: Diagnosis not present

## 2015-06-09 DIAGNOSIS — N186 End stage renal disease: Secondary | ICD-10-CM | POA: Diagnosis not present

## 2015-06-09 DIAGNOSIS — D631 Anemia in chronic kidney disease: Secondary | ICD-10-CM | POA: Diagnosis not present

## 2015-06-09 DIAGNOSIS — D508 Other iron deficiency anemias: Secondary | ICD-10-CM | POA: Diagnosis not present

## 2015-06-10 DIAGNOSIS — Z23 Encounter for immunization: Secondary | ICD-10-CM | POA: Diagnosis not present

## 2015-06-10 DIAGNOSIS — R634 Abnormal weight loss: Secondary | ICD-10-CM | POA: Diagnosis not present

## 2015-06-10 DIAGNOSIS — J4520 Mild intermittent asthma, uncomplicated: Secondary | ICD-10-CM | POA: Diagnosis not present

## 2015-06-10 DIAGNOSIS — M549 Dorsalgia, unspecified: Secondary | ICD-10-CM | POA: Diagnosis not present

## 2015-06-10 DIAGNOSIS — R7309 Other abnormal glucose: Secondary | ICD-10-CM | POA: Diagnosis not present

## 2015-06-10 DIAGNOSIS — C880 Waldenstrom macroglobulinemia: Secondary | ICD-10-CM | POA: Diagnosis not present

## 2015-06-11 DIAGNOSIS — D508 Other iron deficiency anemias: Secondary | ICD-10-CM | POA: Diagnosis not present

## 2015-06-11 DIAGNOSIS — D631 Anemia in chronic kidney disease: Secondary | ICD-10-CM | POA: Diagnosis not present

## 2015-06-11 DIAGNOSIS — N186 End stage renal disease: Secondary | ICD-10-CM | POA: Diagnosis not present

## 2015-06-12 DIAGNOSIS — N186 End stage renal disease: Secondary | ICD-10-CM | POA: Diagnosis not present

## 2015-06-12 DIAGNOSIS — Z992 Dependence on renal dialysis: Secondary | ICD-10-CM | POA: Diagnosis not present

## 2015-06-12 DIAGNOSIS — R69 Illness, unspecified: Secondary | ICD-10-CM | POA: Diagnosis not present

## 2015-06-13 DIAGNOSIS — N186 End stage renal disease: Secondary | ICD-10-CM | POA: Diagnosis not present

## 2015-06-13 DIAGNOSIS — Z23 Encounter for immunization: Secondary | ICD-10-CM | POA: Diagnosis not present

## 2015-06-13 DIAGNOSIS — D508 Other iron deficiency anemias: Secondary | ICD-10-CM | POA: Diagnosis not present

## 2015-06-16 DIAGNOSIS — Z23 Encounter for immunization: Secondary | ICD-10-CM | POA: Diagnosis not present

## 2015-06-16 DIAGNOSIS — D508 Other iron deficiency anemias: Secondary | ICD-10-CM | POA: Diagnosis not present

## 2015-06-16 DIAGNOSIS — N186 End stage renal disease: Secondary | ICD-10-CM | POA: Diagnosis not present

## 2015-06-17 ENCOUNTER — Ambulatory Visit
Admission: RE | Admit: 2015-06-17 | Discharge: 2015-06-17 | Disposition: A | Discharge status: Home / Self Care | Payer: Medicare Other | Source: Ambulatory Visit

## 2015-06-17 DIAGNOSIS — Z1231 Encounter for screening mammogram for malignant neoplasm of breast: Secondary | ICD-10-CM | POA: Diagnosis not present

## 2015-06-18 DIAGNOSIS — Z23 Encounter for immunization: Secondary | ICD-10-CM | POA: Diagnosis not present

## 2015-06-18 DIAGNOSIS — D508 Other iron deficiency anemias: Secondary | ICD-10-CM | POA: Diagnosis not present

## 2015-06-18 DIAGNOSIS — N186 End stage renal disease: Secondary | ICD-10-CM | POA: Diagnosis not present

## 2015-06-20 DIAGNOSIS — Z23 Encounter for immunization: Secondary | ICD-10-CM | POA: Diagnosis not present

## 2015-06-20 DIAGNOSIS — N186 End stage renal disease: Secondary | ICD-10-CM | POA: Diagnosis not present

## 2015-06-20 DIAGNOSIS — D508 Other iron deficiency anemias: Secondary | ICD-10-CM | POA: Diagnosis not present

## 2015-06-23 DIAGNOSIS — Z23 Encounter for immunization: Secondary | ICD-10-CM | POA: Diagnosis not present

## 2015-06-23 DIAGNOSIS — D508 Other iron deficiency anemias: Secondary | ICD-10-CM | POA: Diagnosis not present

## 2015-06-23 DIAGNOSIS — N186 End stage renal disease: Secondary | ICD-10-CM | POA: Diagnosis not present

## 2015-06-24 ENCOUNTER — Ambulatory Visit (HOSPITAL_BASED_OUTPATIENT_CLINIC_OR_DEPARTMENT_OTHER): Payer: Medicare Other

## 2015-06-24 ENCOUNTER — Ambulatory Visit (HOSPITAL_BASED_OUTPATIENT_CLINIC_OR_DEPARTMENT_OTHER): Payer: Medicare Other | Admitting: Hematology & Oncology

## 2015-06-24 ENCOUNTER — Encounter: Payer: Self-pay | Admitting: Hematology & Oncology

## 2015-06-24 VITALS — BP 137/75 | HR 91 | Temp 98.2°F | Resp 16 | Ht 66.0 in | Wt 133.0 lb

## 2015-06-24 DIAGNOSIS — R64 Cachexia: Secondary | ICD-10-CM

## 2015-06-24 DIAGNOSIS — N186 End stage renal disease: Secondary | ICD-10-CM

## 2015-06-24 DIAGNOSIS — C880 Waldenstrom macroglobulinemia: Secondary | ICD-10-CM

## 2015-06-24 DIAGNOSIS — R682 Dry mouth, unspecified: Principal | ICD-10-CM

## 2015-06-24 DIAGNOSIS — N19 Unspecified kidney failure: Secondary | ICD-10-CM

## 2015-06-24 DIAGNOSIS — K117 Disturbances of salivary secretion: Secondary | ICD-10-CM | POA: Diagnosis not present

## 2015-06-24 DIAGNOSIS — Z992 Dependence on renal dialysis: Secondary | ICD-10-CM

## 2015-06-24 LAB — COMPREHENSIVE METABOLIC PANEL (CC13)
ALT: 11 U/L (ref 0–55)
ANION GAP: 10 meq/L (ref 3–11)
AST: 20 U/L (ref 5–34)
Albumin: 3.1 g/dL — ABNORMAL LOW (ref 3.5–5.0)
Alkaline Phosphatase: 78 U/L (ref 40–150)
BUN: 12.9 mg/dL (ref 7.0–26.0)
CHLORIDE: 98 meq/L (ref 98–109)
CO2: 34 meq/L — AB (ref 22–29)
CREATININE: 5 mg/dL — AB (ref 0.6–1.1)
Calcium: 9.3 mg/dL (ref 8.4–10.4)
EGFR: 10 mL/min/{1.73_m2} — AB (ref >90)
GLUCOSE: 89 mg/dL (ref 70–140)
Potassium: 3.4 mEq/L — ABNORMAL LOW (ref 3.5–5.1)
Sodium: 142 mEq/L (ref 136–145)
TOTAL PROTEIN: 6.6 g/dL (ref 6.4–8.3)
Total Bilirubin: 0.61 mg/dL (ref 0.20–1.20)

## 2015-06-24 LAB — CBC WITH DIFFERENTIAL (CANCER CENTER ONLY)
BASO#: 0 10*3/uL (ref 0.0–0.2)
BASO%: 0.6 % (ref 0.0–2.0)
EOS ABS: 0.1 10*3/uL (ref 0.0–0.5)
EOS%: 1.9 % (ref 0.0–7.0)
HCT: 35.4 % (ref 34.8–46.6)
HGB: 11.9 g/dL (ref 11.6–15.9)
LYMPH#: 1.7 10*3/uL (ref 0.9–3.3)
LYMPH%: 32.1 % (ref 14.0–48.0)
MCH: 29.3 pg (ref 26.0–34.0)
MCHC: 33.6 g/dL (ref 32.0–36.0)
MCV: 87 fL (ref 81–101)
MONO#: 0.5 10*3/uL (ref 0.1–0.9)
MONO%: 8.6 % (ref 0.0–13.0)
NEUT#: 3 10*3/uL (ref 1.5–6.5)
NEUT%: 56.8 % (ref 39.6–80.0)
PLATELETS: 274 10*3/uL (ref 145–400)
RBC: 4.06 10*6/uL (ref 3.70–5.32)
RDW: 17 % — AB (ref 11.1–15.7)
WBC: 5.3 10*3/uL (ref 3.9–10.0)

## 2015-06-24 MED ORDER — DRONABINOL 2.5 MG PO CAPS: 2.5000 mg | ORAL_CAPSULE | Freq: Three times a day (TID) | ORAL | Status: AC

## 2015-06-24 NOTE — Progress Notes (Signed)
Hematology and Oncology Follow Up Visit  Shannon Murray [id number] [birth year] 69 y.o. 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 The big news that she has now is that she is moving back up to [place]. Her family lives up there. It is just easier for her to be close to her family.  She's moving up on November 20. This we right before her birthday.  She is doing okay. Dialysis tends to take a lot out of her.  She's losing weight. The weight loss probably is a combination of different factors. She has a Waldenstrom's. She has the renal insufficiency and on dialysis. She also has some pulmonary issues.  I think that she probably needs some Marinol to try to help with her appetite so she does not lose anymore weight. We will put her on Marinol at 2.5 mg by mouth 3 times a day with meals.  As far as he Shannon Murray is concerned, her last M spike was 0.9 g/dL. Her IgM level was 1290 mg/dL.  She has no problems with bleeding. There is no bruising. She has no diarrhea.  She does have some discomfort in the left lower abdomen where she had the abscess excised and drained a year ago. I told her this is probably from nerves trying to regenerate and reconnect.  She's had no fever. She's had no leg swelling.  Overall, her performance status is ECOG 1-2.  Medications:  Current outpatient prescriptions:  .  b complex-vitamin c-folic acid (NEPHRO-VITE) 0.8 MG TABS tablet, Take 1 tablet by mouth at bedtime., Disp: , Rfl:  .  Cetirizine HCl (ZYRTEC ALLERGY PO), Take by mouth every morning., Disp: , Rfl:  .  DULERA 200-5 MCG/ACT AERO, Inhale 2 puffs into the lungs 2 (two) times daily. , Disp: , Rfl:  .  mirtazapine (REMERON) 15 MG tablet, , Disp: , Rfl:  .  pilocarpine (SALAGEN) 5 MG tablet, Take 1  tablet (5 mg total) by mouth 2 (two) times daily., Disp: 60 tablet, Rfl: 2 .  RENVELA 800 MG tablet, Take by mouth 3 (three) times daily with meals. , Disp: , Rfl:  .  RESTASIS 0.05 % ophthalmic emulsion, , Disp: , Rfl:  .  dronabinol (MARINOL) 2.5 MG capsule, Take 1 capsule (2.5 mg total) by mouth 3 (three) times daily before meals., Disp: 90 capsule, Rfl: 0  Allergies:  Allergies  Allergen Reactions  . Ciprofloxacin Itching  . Diphenhydramine Hcl Itching    Past Medical History, Surgical history, Social history, and Family History were reviewed and updated.  Review of Systems: As above  Physical Exam:  height is 5\' 6"  (1.676 m) and weight is 133 lb (60.328 kg). Her oral temperature is 98.2 F (36.8 C). Her blood pressure is 137/75 and her pulse is 91. Her respiration is 16.   Thin but well-nourished African American female. Head and neck exam shows no ocular or oral lesions. She is no palpable cervical or supraclavicular lymph nodes. Lungs are clear. She has no rales, wheezes or rhonchi. Cardiac exam regular rate and rhythm with a 1/6 systolic murmur. Abdomen is soft. The abscess site in her left lower abdomen is healed. There is still a little bit  of firmness and hyper pigmentation noted at the abscess surgical site. No palpable liver or spleen tip is noted.  Back exam shows no tenderness over the spine, ribs or hips. Extremities shows no clubbing, cyanosis or edema. She has good strength in her muscles. She does have some chronic muscle Lach feed in her extremities. She has good range of motion of her joints. Skin exam shows no rashes, ecchymosis or petechia. Neurological exam is nonfocal.  Lab Results  Component Value Date   WBC 5.3 06/24/2015   HGB 11.9 06/24/2015   HCT 35.4 06/24/2015   MCV 87 06/24/2015   PLT 274 06/24/2015     Chemistry      Component Value Date/Time   NA 140 02/18/2015 1053   NA 141 11/19/2014 1157   K 3.5 02/18/2015 1053   K 3.6 11/19/2014 1157   CL  95* 02/18/2015 1053   CL 97 11/19/2014 1157   CO2 33 02/18/2015 1053   CO2 32 11/19/2014 1157   BUN 17 02/18/2015 1053   BUN 15 11/19/2014 1157   CREATININE 5.3* 02/18/2015 1053   CREATININE 4.77* 11/19/2014 1157      Component Value Date/Time   CALCIUM 9.4 02/18/2015 1053   CALCIUM 8.4 11/19/2014 1157   ALKPHOS 60 02/18/2015 1053   ALKPHOS 58 11/19/2014 1157   AST 25 02/18/2015 1053   AST 16 11/19/2014 1157   ALT 14 02/18/2015 1053   ALT 9 11/19/2014 1157   BILITOT 0.80 02/18/2015 1053   BILITOT 0.6 11/19/2014 1157         Impression and Plan: Shannon Murray is 58- year-old [place] American female. She has Waldenstrm's macroglobulinemia. She has chronic renal insufficiency.  It is sad that she is moving back up to [place]. 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  I will copy her medical records so she take these with her.  Hopefully, the Marinol will help her.  From my point of view, the Waldenstrm's has been quite stable and thankfully, she has not had any symptoms.  I think her biggest problem is just the dialysis. This really does wear her out.  I spent a good 30 minutes with her today. We said goodbye to her. Maybe she will be back. However, know that she will be getting very good care up in [place].    Volanda Napoleon, MD 10/12/201612:04 PM

## 2015-06-25 ENCOUNTER — Telehealth: Payer: Self-pay | Admitting: Hematology & Oncology

## 2015-06-25 DIAGNOSIS — N186 End stage renal disease: Secondary | ICD-10-CM | POA: Diagnosis not present

## 2015-06-25 DIAGNOSIS — D508 Other iron deficiency anemias: Secondary | ICD-10-CM | POA: Diagnosis not present

## 2015-06-25 DIAGNOSIS — Z23 Encounter for immunization: Secondary | ICD-10-CM | POA: Diagnosis not present

## 2015-06-25 NOTE — Telephone Encounter (Signed)
[employer] has APPROVED the DRONABINOL for 6 mos @ 90 tab per 30 days.   Efc: 06/25/2015 - 12/24/2015 Auth: [id number]    P: [phone]  Id: [id number]   Dx: [id number]

## 2015-06-26 LAB — IGG, IGA, IGM
IGA: 60 mg/dL — AB (ref 69–380)
IGM, SERUM: 1310 mg/dL — AB (ref 52–322)
IgG (Immunoglobin G), Serum: 832 mg/dL (ref 690–1700)

## 2015-06-26 LAB — PROTEIN ELECTROPHORESIS, SERUM, WITH REFLEX
ABNORMAL PROTEIN BAND1: 0.9 g/dL
ALBUMIN ELP: 3.4 g/dL — AB (ref 3.8–4.8)
Alpha-1-Globulin: 0.3 g/dL (ref 0.2–0.3)
Alpha-2-Globulin: 0.6 g/dL (ref 0.5–0.9)
BETA 2: 1.4 g/dL — AB (ref 0.2–0.5)
BETA GLOBULIN: 0.3 g/dL — AB (ref 0.4–0.6)
Gamma Globulin: 0.5 g/dL — ABNORMAL LOW (ref 0.8–1.7)
TOTAL PROTEIN, SERUM ELECTROPHOR: 6.5 g/dL (ref 6.1–8.1)

## 2015-06-26 LAB — IFE INTERPRETATION

## 2015-06-26 LAB — KAPPA/LAMBDA LIGHT CHAINS
KAPPA FREE LGHT CHN: 15.8 mg/dL — AB (ref 0.33–1.94)
KAPPA LAMBDA RATIO: 1 (ref 0.26–1.65)
Lambda Free Lght Chn: 15.8 mg/dL — ABNORMAL HIGH (ref 0.57–2.63)

## 2015-06-27 DIAGNOSIS — Z23 Encounter for immunization: Secondary | ICD-10-CM | POA: Diagnosis not present

## 2015-06-27 DIAGNOSIS — D508 Other iron deficiency anemias: Secondary | ICD-10-CM | POA: Diagnosis not present

## 2015-06-27 DIAGNOSIS — N186 End stage renal disease: Secondary | ICD-10-CM | POA: Diagnosis not present

## 2015-06-30 DIAGNOSIS — Z23 Encounter for immunization: Secondary | ICD-10-CM | POA: Diagnosis not present

## 2015-06-30 DIAGNOSIS — D508 Other iron deficiency anemias: Secondary | ICD-10-CM | POA: Diagnosis not present

## 2015-06-30 DIAGNOSIS — N186 End stage renal disease: Secondary | ICD-10-CM | POA: Diagnosis not present

## 2015-07-02 DIAGNOSIS — N186 End stage renal disease: Secondary | ICD-10-CM | POA: Diagnosis not present

## 2015-07-02 DIAGNOSIS — D508 Other iron deficiency anemias: Secondary | ICD-10-CM | POA: Diagnosis not present

## 2015-07-02 DIAGNOSIS — Z23 Encounter for immunization: Secondary | ICD-10-CM | POA: Diagnosis not present

## 2015-07-03 ENCOUNTER — Telehealth: Payer: Self-pay | Admitting: Hematology & Oncology

## 2015-07-03 NOTE — Telephone Encounter (Signed)
°  [employer] has APPROVED the DRONABINOL 2.5 MG   Efc: 06/25/2015 - 06/24/2016     P: [phone] Dx: [id number]         COPY SCANNED

## 2015-07-04 DIAGNOSIS — D508 Other iron deficiency anemias: Secondary | ICD-10-CM | POA: Diagnosis not present

## 2015-07-04 DIAGNOSIS — Z23 Encounter for immunization: Secondary | ICD-10-CM | POA: Diagnosis not present

## 2015-07-04 DIAGNOSIS — N186 End stage renal disease: Secondary | ICD-10-CM | POA: Diagnosis not present

## 2015-07-07 DIAGNOSIS — D508 Other iron deficiency anemias: Secondary | ICD-10-CM | POA: Diagnosis not present

## 2015-07-07 DIAGNOSIS — N186 End stage renal disease: Secondary | ICD-10-CM | POA: Diagnosis not present

## 2015-07-07 DIAGNOSIS — Z23 Encounter for immunization: Secondary | ICD-10-CM | POA: Diagnosis not present

## 2015-07-08 DIAGNOSIS — Z6822 Body mass index (BMI) 22.0-22.9, adult: Secondary | ICD-10-CM | POA: Diagnosis not present

## 2015-07-08 DIAGNOSIS — R030 Elevated blood-pressure reading, without diagnosis of hypertension: Secondary | ICD-10-CM | POA: Diagnosis not present

## 2015-07-08 DIAGNOSIS — R634 Abnormal weight loss: Secondary | ICD-10-CM | POA: Diagnosis not present

## 2015-07-09 DIAGNOSIS — D508 Other iron deficiency anemias: Secondary | ICD-10-CM | POA: Diagnosis not present

## 2015-07-09 DIAGNOSIS — Z23 Encounter for immunization: Secondary | ICD-10-CM | POA: Diagnosis not present

## 2015-07-09 DIAGNOSIS — N186 End stage renal disease: Secondary | ICD-10-CM | POA: Diagnosis not present

## 2015-07-11 DIAGNOSIS — N186 End stage renal disease: Secondary | ICD-10-CM | POA: Diagnosis not present

## 2015-07-11 DIAGNOSIS — Z23 Encounter for immunization: Secondary | ICD-10-CM | POA: Diagnosis not present

## 2015-07-11 DIAGNOSIS — D508 Other iron deficiency anemias: Secondary | ICD-10-CM | POA: Diagnosis not present

## 2015-07-13 DIAGNOSIS — Z992 Dependence on renal dialysis: Secondary | ICD-10-CM | POA: Diagnosis not present

## 2015-07-13 DIAGNOSIS — R69 Illness, unspecified: Secondary | ICD-10-CM | POA: Diagnosis not present

## 2015-07-13 DIAGNOSIS — N186 End stage renal disease: Secondary | ICD-10-CM | POA: Diagnosis not present

## 2015-07-14 DIAGNOSIS — N2581 Secondary hyperparathyroidism of renal origin: Secondary | ICD-10-CM | POA: Diagnosis not present

## 2015-07-14 DIAGNOSIS — Z23 Encounter for immunization: Secondary | ICD-10-CM | POA: Diagnosis not present

## 2015-07-14 DIAGNOSIS — N186 End stage renal disease: Secondary | ICD-10-CM | POA: Diagnosis not present

## 2015-07-14 DIAGNOSIS — D631 Anemia in chronic kidney disease: Secondary | ICD-10-CM | POA: Diagnosis not present

## 2015-07-16 DIAGNOSIS — N2581 Secondary hyperparathyroidism of renal origin: Secondary | ICD-10-CM | POA: Diagnosis not present

## 2015-07-16 DIAGNOSIS — Z23 Encounter for immunization: Secondary | ICD-10-CM | POA: Diagnosis not present

## 2015-07-16 DIAGNOSIS — D631 Anemia in chronic kidney disease: Secondary | ICD-10-CM | POA: Diagnosis not present

## 2015-07-16 DIAGNOSIS — N186 End stage renal disease: Secondary | ICD-10-CM | POA: Diagnosis not present

## 2015-07-18 DIAGNOSIS — N186 End stage renal disease: Secondary | ICD-10-CM | POA: Diagnosis not present

## 2015-07-18 DIAGNOSIS — N2581 Secondary hyperparathyroidism of renal origin: Secondary | ICD-10-CM | POA: Diagnosis not present

## 2015-07-18 DIAGNOSIS — Z23 Encounter for immunization: Secondary | ICD-10-CM | POA: Diagnosis not present

## 2015-07-18 DIAGNOSIS — D631 Anemia in chronic kidney disease: Secondary | ICD-10-CM | POA: Diagnosis not present

## 2015-07-21 DIAGNOSIS — N186 End stage renal disease: Secondary | ICD-10-CM | POA: Diagnosis not present

## 2015-07-21 DIAGNOSIS — Z23 Encounter for immunization: Secondary | ICD-10-CM | POA: Diagnosis not present

## 2015-07-21 DIAGNOSIS — N2581 Secondary hyperparathyroidism of renal origin: Secondary | ICD-10-CM | POA: Diagnosis not present

## 2015-07-21 DIAGNOSIS — D631 Anemia in chronic kidney disease: Secondary | ICD-10-CM | POA: Diagnosis not present

## 2015-07-23 DIAGNOSIS — D631 Anemia in chronic kidney disease: Secondary | ICD-10-CM | POA: Diagnosis not present

## 2015-07-23 DIAGNOSIS — N186 End stage renal disease: Secondary | ICD-10-CM | POA: Diagnosis not present

## 2015-07-23 DIAGNOSIS — Z23 Encounter for immunization: Secondary | ICD-10-CM | POA: Diagnosis not present

## 2015-07-23 DIAGNOSIS — N2581 Secondary hyperparathyroidism of renal origin: Secondary | ICD-10-CM | POA: Diagnosis not present

## 2015-07-25 DIAGNOSIS — N186 End stage renal disease: Secondary | ICD-10-CM | POA: Diagnosis not present

## 2015-07-25 DIAGNOSIS — N2581 Secondary hyperparathyroidism of renal origin: Secondary | ICD-10-CM | POA: Diagnosis not present

## 2015-07-25 DIAGNOSIS — D631 Anemia in chronic kidney disease: Secondary | ICD-10-CM | POA: Diagnosis not present

## 2015-07-25 DIAGNOSIS — Z23 Encounter for immunization: Secondary | ICD-10-CM | POA: Diagnosis not present

## 2015-07-28 DIAGNOSIS — Z23 Encounter for immunization: Secondary | ICD-10-CM | POA: Diagnosis not present

## 2015-07-28 DIAGNOSIS — N186 End stage renal disease: Secondary | ICD-10-CM | POA: Diagnosis not present

## 2015-07-28 DIAGNOSIS — N2581 Secondary hyperparathyroidism of renal origin: Secondary | ICD-10-CM | POA: Diagnosis not present

## 2015-07-28 DIAGNOSIS — D631 Anemia in chronic kidney disease: Secondary | ICD-10-CM | POA: Diagnosis not present

## 2015-07-30 DIAGNOSIS — Z23 Encounter for immunization: Secondary | ICD-10-CM | POA: Diagnosis not present

## 2015-07-30 DIAGNOSIS — N2581 Secondary hyperparathyroidism of renal origin: Secondary | ICD-10-CM | POA: Diagnosis not present

## 2015-07-30 DIAGNOSIS — N186 End stage renal disease: Secondary | ICD-10-CM | POA: Diagnosis not present

## 2015-07-30 DIAGNOSIS — R69 Illness, unspecified: Secondary | ICD-10-CM | POA: Diagnosis not present

## 2015-07-30 DIAGNOSIS — D631 Anemia in chronic kidney disease: Secondary | ICD-10-CM | POA: Diagnosis not present

## 2015-07-30 DIAGNOSIS — Z992 Dependence on renal dialysis: Secondary | ICD-10-CM | POA: Diagnosis not present

## 2015-08-03 DIAGNOSIS — N186 End stage renal disease: Secondary | ICD-10-CM | POA: Diagnosis not present

## 2015-08-05 DIAGNOSIS — N186 End stage renal disease: Secondary | ICD-10-CM | POA: Diagnosis not present

## 2015-08-08 DIAGNOSIS — N186 End stage renal disease: Secondary | ICD-10-CM | POA: Diagnosis not present

## 2015-08-11 DIAGNOSIS — N186 End stage renal disease: Secondary | ICD-10-CM | POA: Diagnosis not present

## 2015-08-13 DIAGNOSIS — D631 Anemia in chronic kidney disease: Secondary | ICD-10-CM | POA: Diagnosis not present

## 2015-08-13 DIAGNOSIS — E889 Metabolic disorder, unspecified: Secondary | ICD-10-CM | POA: Diagnosis not present

## 2015-08-13 DIAGNOSIS — N186 End stage renal disease: Secondary | ICD-10-CM | POA: Diagnosis not present

## 2015-08-18 DIAGNOSIS — D631 Anemia in chronic kidney disease: Secondary | ICD-10-CM | POA: Diagnosis not present

## 2015-08-18 DIAGNOSIS — E889 Metabolic disorder, unspecified: Secondary | ICD-10-CM | POA: Diagnosis not present

## 2015-08-18 DIAGNOSIS — N186 End stage renal disease: Secondary | ICD-10-CM | POA: Diagnosis not present

## 2015-08-20 DIAGNOSIS — D631 Anemia in chronic kidney disease: Secondary | ICD-10-CM | POA: Diagnosis not present

## 2015-08-20 DIAGNOSIS — E889 Metabolic disorder, unspecified: Secondary | ICD-10-CM | POA: Diagnosis not present

## 2015-08-20 DIAGNOSIS — N186 End stage renal disease: Secondary | ICD-10-CM | POA: Diagnosis not present

## 2015-08-22 DIAGNOSIS — D631 Anemia in chronic kidney disease: Secondary | ICD-10-CM | POA: Diagnosis not present

## 2015-08-22 DIAGNOSIS — N186 End stage renal disease: Secondary | ICD-10-CM | POA: Diagnosis not present

## 2015-08-22 DIAGNOSIS — E889 Metabolic disorder, unspecified: Secondary | ICD-10-CM | POA: Diagnosis not present

## 2015-08-25 DIAGNOSIS — D631 Anemia in chronic kidney disease: Secondary | ICD-10-CM | POA: Diagnosis not present

## 2015-08-25 DIAGNOSIS — N186 End stage renal disease: Secondary | ICD-10-CM | POA: Diagnosis not present

## 2015-08-25 DIAGNOSIS — E889 Metabolic disorder, unspecified: Secondary | ICD-10-CM | POA: Diagnosis not present

## 2015-08-27 DIAGNOSIS — N186 End stage renal disease: Secondary | ICD-10-CM | POA: Diagnosis not present

## 2015-08-27 DIAGNOSIS — D631 Anemia in chronic kidney disease: Secondary | ICD-10-CM | POA: Diagnosis not present

## 2015-08-27 DIAGNOSIS — E889 Metabolic disorder, unspecified: Secondary | ICD-10-CM | POA: Diagnosis not present

## 2015-08-29 DIAGNOSIS — D631 Anemia in chronic kidney disease: Secondary | ICD-10-CM | POA: Diagnosis not present

## 2015-08-29 DIAGNOSIS — E889 Metabolic disorder, unspecified: Secondary | ICD-10-CM | POA: Diagnosis not present

## 2015-08-29 DIAGNOSIS — N186 End stage renal disease: Secondary | ICD-10-CM | POA: Diagnosis not present

## 2015-09-01 DIAGNOSIS — E889 Metabolic disorder, unspecified: Secondary | ICD-10-CM | POA: Diagnosis not present

## 2015-09-01 DIAGNOSIS — D631 Anemia in chronic kidney disease: Secondary | ICD-10-CM | POA: Diagnosis not present

## 2015-09-01 DIAGNOSIS — N186 End stage renal disease: Secondary | ICD-10-CM | POA: Diagnosis not present

## 2015-09-03 DIAGNOSIS — E889 Metabolic disorder, unspecified: Secondary | ICD-10-CM | POA: Diagnosis not present

## 2015-09-03 DIAGNOSIS — D631 Anemia in chronic kidney disease: Secondary | ICD-10-CM | POA: Diagnosis not present

## 2015-09-03 DIAGNOSIS — N186 End stage renal disease: Secondary | ICD-10-CM | POA: Diagnosis not present

## 2015-09-05 DIAGNOSIS — E889 Metabolic disorder, unspecified: Secondary | ICD-10-CM | POA: Diagnosis not present

## 2015-09-05 DIAGNOSIS — D631 Anemia in chronic kidney disease: Secondary | ICD-10-CM | POA: Diagnosis not present

## 2015-09-05 DIAGNOSIS — N186 End stage renal disease: Secondary | ICD-10-CM | POA: Diagnosis not present

## 2015-09-08 DIAGNOSIS — E889 Metabolic disorder, unspecified: Secondary | ICD-10-CM | POA: Diagnosis not present

## 2015-09-08 DIAGNOSIS — N186 End stage renal disease: Secondary | ICD-10-CM | POA: Diagnosis not present

## 2015-09-08 DIAGNOSIS — D631 Anemia in chronic kidney disease: Secondary | ICD-10-CM | POA: Diagnosis not present

## 2015-09-10 DIAGNOSIS — E889 Metabolic disorder, unspecified: Secondary | ICD-10-CM | POA: Diagnosis not present

## 2015-09-10 DIAGNOSIS — N186 End stage renal disease: Secondary | ICD-10-CM | POA: Diagnosis not present

## 2015-09-10 DIAGNOSIS — D631 Anemia in chronic kidney disease: Secondary | ICD-10-CM | POA: Diagnosis not present

## 2015-09-12 DIAGNOSIS — N186 End stage renal disease: Secondary | ICD-10-CM | POA: Diagnosis not present

## 2015-09-12 DIAGNOSIS — E889 Metabolic disorder, unspecified: Secondary | ICD-10-CM | POA: Diagnosis not present

## 2015-09-12 DIAGNOSIS — D631 Anemia in chronic kidney disease: Secondary | ICD-10-CM | POA: Diagnosis not present

## 2015-09-13 DIAGNOSIS — N186 End stage renal disease: Secondary | ICD-10-CM | POA: Diagnosis not present

## 2015-09-15 DIAGNOSIS — D631 Anemia in chronic kidney disease: Secondary | ICD-10-CM | POA: Diagnosis not present

## 2015-09-15 DIAGNOSIS — N186 End stage renal disease: Secondary | ICD-10-CM | POA: Diagnosis not present

## 2015-09-17 DIAGNOSIS — N186 End stage renal disease: Secondary | ICD-10-CM | POA: Diagnosis not present

## 2015-09-17 DIAGNOSIS — D631 Anemia in chronic kidney disease: Secondary | ICD-10-CM | POA: Diagnosis not present

## 2015-09-19 DIAGNOSIS — N186 End stage renal disease: Secondary | ICD-10-CM | POA: Diagnosis not present

## 2015-09-19 DIAGNOSIS — D631 Anemia in chronic kidney disease: Secondary | ICD-10-CM | POA: Diagnosis not present

## 2015-09-22 DIAGNOSIS — N186 End stage renal disease: Secondary | ICD-10-CM | POA: Diagnosis not present

## 2015-09-22 DIAGNOSIS — D631 Anemia in chronic kidney disease: Secondary | ICD-10-CM | POA: Diagnosis not present

## 2015-09-24 DIAGNOSIS — N186 End stage renal disease: Secondary | ICD-10-CM | POA: Diagnosis not present

## 2015-09-24 DIAGNOSIS — D631 Anemia in chronic kidney disease: Secondary | ICD-10-CM | POA: Diagnosis not present

## 2015-09-26 DIAGNOSIS — D631 Anemia in chronic kidney disease: Secondary | ICD-10-CM | POA: Diagnosis not present

## 2015-09-26 DIAGNOSIS — N186 End stage renal disease: Secondary | ICD-10-CM | POA: Diagnosis not present

## 2015-09-29 DIAGNOSIS — D631 Anemia in chronic kidney disease: Secondary | ICD-10-CM | POA: Diagnosis not present

## 2015-09-29 DIAGNOSIS — N186 End stage renal disease: Secondary | ICD-10-CM | POA: Diagnosis not present

## 2015-10-01 DIAGNOSIS — N186 End stage renal disease: Secondary | ICD-10-CM | POA: Diagnosis not present

## 2015-10-01 DIAGNOSIS — D631 Anemia in chronic kidney disease: Secondary | ICD-10-CM | POA: Diagnosis not present

## 2015-10-03 DIAGNOSIS — D631 Anemia in chronic kidney disease: Secondary | ICD-10-CM | POA: Diagnosis not present

## 2015-10-03 DIAGNOSIS — N186 End stage renal disease: Secondary | ICD-10-CM | POA: Diagnosis not present

## 2015-10-06 DIAGNOSIS — D631 Anemia in chronic kidney disease: Secondary | ICD-10-CM | POA: Diagnosis not present

## 2015-10-06 DIAGNOSIS — N186 End stage renal disease: Secondary | ICD-10-CM | POA: Diagnosis not present

## 2015-10-08 DIAGNOSIS — N186 End stage renal disease: Secondary | ICD-10-CM | POA: Diagnosis not present

## 2015-10-08 DIAGNOSIS — D631 Anemia in chronic kidney disease: Secondary | ICD-10-CM | POA: Diagnosis not present

## 2015-10-09 DIAGNOSIS — C801 Malignant (primary) neoplasm, unspecified: Secondary | ICD-10-CM | POA: Diagnosis not present

## 2015-10-09 DIAGNOSIS — N186 End stage renal disease: Secondary | ICD-10-CM | POA: Diagnosis not present

## 2015-10-09 DIAGNOSIS — I120 Hypertensive chronic kidney disease with stage 5 chronic kidney disease or end stage renal disease: Secondary | ICD-10-CM | POA: Diagnosis not present

## 2015-10-09 DIAGNOSIS — T82898A Other specified complication of vascular prosthetic devices, implants and grafts, initial encounter: Secondary | ICD-10-CM | POA: Diagnosis not present

## 2015-10-10 DIAGNOSIS — N186 End stage renal disease: Secondary | ICD-10-CM | POA: Diagnosis not present

## 2015-10-10 DIAGNOSIS — D631 Anemia in chronic kidney disease: Secondary | ICD-10-CM | POA: Diagnosis not present

## 2015-10-13 DIAGNOSIS — N186 End stage renal disease: Secondary | ICD-10-CM | POA: Diagnosis not present

## 2015-10-13 DIAGNOSIS — D631 Anemia in chronic kidney disease: Secondary | ICD-10-CM | POA: Diagnosis not present

## 2015-10-14 DIAGNOSIS — N186 End stage renal disease: Secondary | ICD-10-CM | POA: Diagnosis not present

## 2015-10-15 DIAGNOSIS — D631 Anemia in chronic kidney disease: Secondary | ICD-10-CM | POA: Diagnosis not present

## 2015-10-15 DIAGNOSIS — N186 End stage renal disease: Secondary | ICD-10-CM | POA: Diagnosis not present

## 2015-10-15 DIAGNOSIS — Z23 Encounter for immunization: Secondary | ICD-10-CM | POA: Diagnosis not present

## 2015-10-17 DIAGNOSIS — D631 Anemia in chronic kidney disease: Secondary | ICD-10-CM | POA: Diagnosis not present

## 2015-10-17 DIAGNOSIS — Z23 Encounter for immunization: Secondary | ICD-10-CM | POA: Diagnosis not present

## 2015-10-17 DIAGNOSIS — N186 End stage renal disease: Secondary | ICD-10-CM | POA: Diagnosis not present

## 2015-10-20 DIAGNOSIS — N186 End stage renal disease: Secondary | ICD-10-CM | POA: Diagnosis not present

## 2015-10-20 DIAGNOSIS — Z23 Encounter for immunization: Secondary | ICD-10-CM | POA: Diagnosis not present

## 2015-10-20 DIAGNOSIS — D631 Anemia in chronic kidney disease: Secondary | ICD-10-CM | POA: Diagnosis not present

## 2015-10-21 DIAGNOSIS — Z23 Encounter for immunization: Secondary | ICD-10-CM | POA: Diagnosis not present

## 2015-10-21 DIAGNOSIS — N186 End stage renal disease: Secondary | ICD-10-CM | POA: Diagnosis not present

## 2015-10-21 DIAGNOSIS — D631 Anemia in chronic kidney disease: Secondary | ICD-10-CM | POA: Diagnosis not present

## 2015-10-24 DIAGNOSIS — D631 Anemia in chronic kidney disease: Secondary | ICD-10-CM | POA: Diagnosis not present

## 2015-10-24 DIAGNOSIS — N186 End stage renal disease: Secondary | ICD-10-CM | POA: Diagnosis not present

## 2015-10-24 DIAGNOSIS — Z23 Encounter for immunization: Secondary | ICD-10-CM | POA: Diagnosis not present

## 2015-10-27 DIAGNOSIS — D631 Anemia in chronic kidney disease: Secondary | ICD-10-CM | POA: Diagnosis not present

## 2015-10-27 DIAGNOSIS — N186 End stage renal disease: Secondary | ICD-10-CM | POA: Diagnosis not present

## 2015-10-27 DIAGNOSIS — Z23 Encounter for immunization: Secondary | ICD-10-CM | POA: Diagnosis not present

## 2015-10-29 DIAGNOSIS — Z23 Encounter for immunization: Secondary | ICD-10-CM | POA: Diagnosis not present

## 2015-10-29 DIAGNOSIS — D631 Anemia in chronic kidney disease: Secondary | ICD-10-CM | POA: Diagnosis not present

## 2015-10-29 DIAGNOSIS — N186 End stage renal disease: Secondary | ICD-10-CM | POA: Diagnosis not present

## 2015-10-31 DIAGNOSIS — D631 Anemia in chronic kidney disease: Secondary | ICD-10-CM | POA: Diagnosis not present

## 2015-10-31 DIAGNOSIS — N186 End stage renal disease: Secondary | ICD-10-CM | POA: Diagnosis not present

## 2015-10-31 DIAGNOSIS — Z23 Encounter for immunization: Secondary | ICD-10-CM | POA: Diagnosis not present

## 2015-11-03 DIAGNOSIS — Z23 Encounter for immunization: Secondary | ICD-10-CM | POA: Diagnosis not present

## 2015-11-03 DIAGNOSIS — N186 End stage renal disease: Secondary | ICD-10-CM | POA: Diagnosis not present

## 2015-11-03 DIAGNOSIS — D631 Anemia in chronic kidney disease: Secondary | ICD-10-CM | POA: Diagnosis not present

## 2015-11-05 DIAGNOSIS — N186 End stage renal disease: Secondary | ICD-10-CM | POA: Diagnosis not present

## 2015-11-05 DIAGNOSIS — D631 Anemia in chronic kidney disease: Secondary | ICD-10-CM | POA: Diagnosis not present

## 2015-11-05 DIAGNOSIS — Z23 Encounter for immunization: Secondary | ICD-10-CM | POA: Diagnosis not present

## 2015-11-07 DIAGNOSIS — D631 Anemia in chronic kidney disease: Secondary | ICD-10-CM | POA: Diagnosis not present

## 2015-11-07 DIAGNOSIS — Z23 Encounter for immunization: Secondary | ICD-10-CM | POA: Diagnosis not present

## 2015-11-07 DIAGNOSIS — N186 End stage renal disease: Secondary | ICD-10-CM | POA: Diagnosis not present

## 2015-11-10 DIAGNOSIS — Z23 Encounter for immunization: Secondary | ICD-10-CM | POA: Diagnosis not present

## 2015-11-10 DIAGNOSIS — N186 End stage renal disease: Secondary | ICD-10-CM | POA: Diagnosis not present

## 2015-11-10 DIAGNOSIS — D631 Anemia in chronic kidney disease: Secondary | ICD-10-CM | POA: Diagnosis not present

## 2015-11-11 DIAGNOSIS — N186 End stage renal disease: Secondary | ICD-10-CM | POA: Diagnosis not present

## 2015-11-12 DIAGNOSIS — N186 End stage renal disease: Secondary | ICD-10-CM | POA: Diagnosis not present

## 2015-11-14 DIAGNOSIS — N186 End stage renal disease: Secondary | ICD-10-CM | POA: Diagnosis not present

## 2015-11-17 DIAGNOSIS — N186 End stage renal disease: Secondary | ICD-10-CM | POA: Diagnosis not present

## 2015-11-19 DIAGNOSIS — N186 End stage renal disease: Secondary | ICD-10-CM | POA: Diagnosis not present

## 2015-11-21 DIAGNOSIS — N186 End stage renal disease: Secondary | ICD-10-CM | POA: Diagnosis not present

## 2015-11-23 DIAGNOSIS — N186 End stage renal disease: Secondary | ICD-10-CM | POA: Diagnosis not present

## 2015-11-26 DIAGNOSIS — N186 End stage renal disease: Secondary | ICD-10-CM | POA: Diagnosis not present

## 2015-11-28 DIAGNOSIS — N186 End stage renal disease: Secondary | ICD-10-CM | POA: Diagnosis not present

## 2015-12-01 DIAGNOSIS — N186 End stage renal disease: Secondary | ICD-10-CM | POA: Diagnosis not present

## 2015-12-03 DIAGNOSIS — N186 End stage renal disease: Secondary | ICD-10-CM | POA: Diagnosis not present

## 2015-12-05 DIAGNOSIS — N186 End stage renal disease: Secondary | ICD-10-CM | POA: Diagnosis not present

## 2015-12-08 DIAGNOSIS — C880 Waldenstrom macroglobulinemia: Secondary | ICD-10-CM | POA: Diagnosis not present

## 2015-12-08 DIAGNOSIS — N186 End stage renal disease: Secondary | ICD-10-CM | POA: Diagnosis not present

## 2015-12-10 DIAGNOSIS — N186 End stage renal disease: Secondary | ICD-10-CM | POA: Diagnosis not present

## 2015-12-12 DIAGNOSIS — N186 End stage renal disease: Secondary | ICD-10-CM | POA: Diagnosis not present

## 2015-12-15 DIAGNOSIS — N186 End stage renal disease: Secondary | ICD-10-CM | POA: Diagnosis not present

## 2015-12-17 DIAGNOSIS — N186 End stage renal disease: Secondary | ICD-10-CM | POA: Diagnosis not present

## 2015-12-19 DIAGNOSIS — N186 End stage renal disease: Secondary | ICD-10-CM | POA: Diagnosis not present

## 2015-12-22 DIAGNOSIS — N186 End stage renal disease: Secondary | ICD-10-CM | POA: Diagnosis not present

## 2015-12-24 DIAGNOSIS — N186 End stage renal disease: Secondary | ICD-10-CM | POA: Diagnosis not present

## 2015-12-26 DIAGNOSIS — N186 End stage renal disease: Secondary | ICD-10-CM | POA: Diagnosis not present

## 2015-12-29 DIAGNOSIS — N186 End stage renal disease: Secondary | ICD-10-CM | POA: Diagnosis not present

## 2015-12-31 DIAGNOSIS — N186 End stage renal disease: Secondary | ICD-10-CM | POA: Diagnosis not present

## 2016-01-02 DIAGNOSIS — N186 End stage renal disease: Secondary | ICD-10-CM | POA: Diagnosis not present

## 2016-01-05 DIAGNOSIS — N186 End stage renal disease: Secondary | ICD-10-CM | POA: Diagnosis not present

## 2016-01-07 DIAGNOSIS — N186 End stage renal disease: Secondary | ICD-10-CM | POA: Diagnosis not present

## 2016-01-09 DIAGNOSIS — N186 End stage renal disease: Secondary | ICD-10-CM | POA: Diagnosis not present

## 2016-01-11 DIAGNOSIS — N186 End stage renal disease: Secondary | ICD-10-CM | POA: Diagnosis not present

## 2016-01-11 DIAGNOSIS — D631 Anemia in chronic kidney disease: Secondary | ICD-10-CM | POA: Diagnosis not present

## 2016-01-14 DIAGNOSIS — D631 Anemia in chronic kidney disease: Secondary | ICD-10-CM | POA: Diagnosis not present

## 2016-01-14 DIAGNOSIS — N186 End stage renal disease: Secondary | ICD-10-CM | POA: Diagnosis not present

## 2016-01-16 DIAGNOSIS — D631 Anemia in chronic kidney disease: Secondary | ICD-10-CM | POA: Diagnosis not present

## 2016-01-16 DIAGNOSIS — N186 End stage renal disease: Secondary | ICD-10-CM | POA: Diagnosis not present

## 2016-01-19 DIAGNOSIS — D631 Anemia in chronic kidney disease: Secondary | ICD-10-CM | POA: Diagnosis not present

## 2016-01-19 DIAGNOSIS — N186 End stage renal disease: Secondary | ICD-10-CM | POA: Diagnosis not present

## 2016-01-21 DIAGNOSIS — N186 End stage renal disease: Secondary | ICD-10-CM | POA: Diagnosis not present

## 2016-01-21 DIAGNOSIS — D631 Anemia in chronic kidney disease: Secondary | ICD-10-CM | POA: Diagnosis not present

## 2016-01-23 DIAGNOSIS — D631 Anemia in chronic kidney disease: Secondary | ICD-10-CM | POA: Diagnosis not present

## 2016-01-23 DIAGNOSIS — N186 End stage renal disease: Secondary | ICD-10-CM | POA: Diagnosis not present

## 2016-01-26 DIAGNOSIS — D631 Anemia in chronic kidney disease: Secondary | ICD-10-CM | POA: Diagnosis not present

## 2016-01-26 DIAGNOSIS — N186 End stage renal disease: Secondary | ICD-10-CM | POA: Diagnosis not present

## 2016-01-28 DIAGNOSIS — D631 Anemia in chronic kidney disease: Secondary | ICD-10-CM | POA: Diagnosis not present

## 2016-01-28 DIAGNOSIS — N186 End stage renal disease: Secondary | ICD-10-CM | POA: Diagnosis not present

## 2016-01-30 DIAGNOSIS — N186 End stage renal disease: Secondary | ICD-10-CM | POA: Diagnosis not present

## 2016-01-30 DIAGNOSIS — D631 Anemia in chronic kidney disease: Secondary | ICD-10-CM | POA: Diagnosis not present

## 2016-02-02 DIAGNOSIS — D631 Anemia in chronic kidney disease: Secondary | ICD-10-CM | POA: Diagnosis not present

## 2016-02-02 DIAGNOSIS — N186 End stage renal disease: Secondary | ICD-10-CM | POA: Diagnosis not present

## 2016-02-04 DIAGNOSIS — D631 Anemia in chronic kidney disease: Secondary | ICD-10-CM | POA: Diagnosis not present

## 2016-02-04 DIAGNOSIS — N186 End stage renal disease: Secondary | ICD-10-CM | POA: Diagnosis not present

## 2016-02-06 DIAGNOSIS — D631 Anemia in chronic kidney disease: Secondary | ICD-10-CM | POA: Diagnosis not present

## 2016-02-06 DIAGNOSIS — N186 End stage renal disease: Secondary | ICD-10-CM | POA: Diagnosis not present

## 2016-02-09 DIAGNOSIS — N186 End stage renal disease: Secondary | ICD-10-CM | POA: Diagnosis not present

## 2016-02-09 DIAGNOSIS — D631 Anemia in chronic kidney disease: Secondary | ICD-10-CM | POA: Diagnosis not present

## 2016-02-11 DIAGNOSIS — N186 End stage renal disease: Secondary | ICD-10-CM | POA: Diagnosis not present

## 2016-02-11 DIAGNOSIS — D631 Anemia in chronic kidney disease: Secondary | ICD-10-CM | POA: Diagnosis not present

## 2016-02-13 DIAGNOSIS — D631 Anemia in chronic kidney disease: Secondary | ICD-10-CM | POA: Diagnosis not present

## 2016-02-13 DIAGNOSIS — N186 End stage renal disease: Secondary | ICD-10-CM | POA: Diagnosis not present

## 2016-02-16 DIAGNOSIS — N186 End stage renal disease: Secondary | ICD-10-CM | POA: Diagnosis not present

## 2016-02-16 DIAGNOSIS — D631 Anemia in chronic kidney disease: Secondary | ICD-10-CM | POA: Diagnosis not present

## 2016-02-18 DIAGNOSIS — D631 Anemia in chronic kidney disease: Secondary | ICD-10-CM | POA: Diagnosis not present

## 2016-02-18 DIAGNOSIS — N186 End stage renal disease: Secondary | ICD-10-CM | POA: Diagnosis not present

## 2016-02-20 DIAGNOSIS — N186 End stage renal disease: Secondary | ICD-10-CM | POA: Diagnosis not present

## 2016-02-20 DIAGNOSIS — D631 Anemia in chronic kidney disease: Secondary | ICD-10-CM | POA: Diagnosis not present

## 2016-02-23 DIAGNOSIS — D631 Anemia in chronic kidney disease: Secondary | ICD-10-CM | POA: Diagnosis not present

## 2016-02-23 DIAGNOSIS — N186 End stage renal disease: Secondary | ICD-10-CM | POA: Diagnosis not present

## 2016-02-25 DIAGNOSIS — N186 End stage renal disease: Secondary | ICD-10-CM | POA: Diagnosis not present

## 2016-02-25 DIAGNOSIS — D631 Anemia in chronic kidney disease: Secondary | ICD-10-CM | POA: Diagnosis not present

## 2016-02-27 DIAGNOSIS — N186 End stage renal disease: Secondary | ICD-10-CM | POA: Diagnosis not present

## 2016-02-27 DIAGNOSIS — D631 Anemia in chronic kidney disease: Secondary | ICD-10-CM | POA: Diagnosis not present

## 2016-03-01 DIAGNOSIS — N186 End stage renal disease: Secondary | ICD-10-CM | POA: Diagnosis not present

## 2016-03-01 DIAGNOSIS — D631 Anemia in chronic kidney disease: Secondary | ICD-10-CM | POA: Diagnosis not present

## 2016-03-03 DIAGNOSIS — D631 Anemia in chronic kidney disease: Secondary | ICD-10-CM | POA: Diagnosis not present

## 2016-03-03 DIAGNOSIS — N186 End stage renal disease: Secondary | ICD-10-CM | POA: Diagnosis not present

## 2016-03-05 DIAGNOSIS — N186 End stage renal disease: Secondary | ICD-10-CM | POA: Diagnosis not present

## 2016-03-05 DIAGNOSIS — D631 Anemia in chronic kidney disease: Secondary | ICD-10-CM | POA: Diagnosis not present

## 2016-03-08 DIAGNOSIS — N186 End stage renal disease: Secondary | ICD-10-CM | POA: Diagnosis not present

## 2016-03-08 DIAGNOSIS — D631 Anemia in chronic kidney disease: Secondary | ICD-10-CM | POA: Diagnosis not present

## 2016-03-10 DIAGNOSIS — N186 End stage renal disease: Secondary | ICD-10-CM | POA: Diagnosis not present

## 2016-03-10 DIAGNOSIS — D631 Anemia in chronic kidney disease: Secondary | ICD-10-CM | POA: Diagnosis not present

## 2016-03-12 DIAGNOSIS — D631 Anemia in chronic kidney disease: Secondary | ICD-10-CM | POA: Diagnosis not present

## 2016-03-12 DIAGNOSIS — N186 End stage renal disease: Secondary | ICD-10-CM | POA: Diagnosis not present

## 2016-03-15 DIAGNOSIS — D631 Anemia in chronic kidney disease: Secondary | ICD-10-CM | POA: Diagnosis not present

## 2016-03-15 DIAGNOSIS — N186 End stage renal disease: Secondary | ICD-10-CM | POA: Diagnosis not present

## 2016-03-17 DIAGNOSIS — D631 Anemia in chronic kidney disease: Secondary | ICD-10-CM | POA: Diagnosis not present

## 2016-03-17 DIAGNOSIS — N186 End stage renal disease: Secondary | ICD-10-CM | POA: Diagnosis not present

## 2016-03-18 DIAGNOSIS — R109 Unspecified abdominal pain: Secondary | ICD-10-CM | POA: Diagnosis not present

## 2016-03-18 DIAGNOSIS — Z9049 Acquired absence of other specified parts of digestive tract: Secondary | ICD-10-CM | POA: Diagnosis not present

## 2016-03-18 DIAGNOSIS — R634 Abnormal weight loss: Secondary | ICD-10-CM | POA: Diagnosis not present

## 2016-03-18 DIAGNOSIS — Z90710 Acquired absence of both cervix and uterus: Secondary | ICD-10-CM | POA: Diagnosis not present

## 2016-03-18 DIAGNOSIS — R160 Hepatomegaly, not elsewhere classified: Secondary | ICD-10-CM | POA: Diagnosis not present

## 2016-03-19 DIAGNOSIS — N186 End stage renal disease: Secondary | ICD-10-CM | POA: Diagnosis not present

## 2016-03-19 DIAGNOSIS — D631 Anemia in chronic kidney disease: Secondary | ICD-10-CM | POA: Diagnosis not present

## 2016-03-22 DIAGNOSIS — D631 Anemia in chronic kidney disease: Secondary | ICD-10-CM | POA: Diagnosis not present

## 2016-03-22 DIAGNOSIS — N186 End stage renal disease: Secondary | ICD-10-CM | POA: Diagnosis not present

## 2016-03-22 DIAGNOSIS — C880 Waldenstrom macroglobulinemia: Secondary | ICD-10-CM | POA: Diagnosis not present

## 2016-03-24 DIAGNOSIS — D631 Anemia in chronic kidney disease: Secondary | ICD-10-CM | POA: Diagnosis not present

## 2016-03-24 DIAGNOSIS — N186 End stage renal disease: Secondary | ICD-10-CM | POA: Diagnosis not present

## 2016-03-26 DIAGNOSIS — N186 End stage renal disease: Secondary | ICD-10-CM | POA: Diagnosis not present

## 2016-03-26 DIAGNOSIS — D631 Anemia in chronic kidney disease: Secondary | ICD-10-CM | POA: Diagnosis not present

## 2016-03-29 DIAGNOSIS — D631 Anemia in chronic kidney disease: Secondary | ICD-10-CM | POA: Diagnosis not present

## 2016-03-29 DIAGNOSIS — N186 End stage renal disease: Secondary | ICD-10-CM | POA: Diagnosis not present

## 2016-03-31 DIAGNOSIS — D631 Anemia in chronic kidney disease: Secondary | ICD-10-CM | POA: Diagnosis not present

## 2016-03-31 DIAGNOSIS — N186 End stage renal disease: Secondary | ICD-10-CM | POA: Diagnosis not present

## 2016-04-02 DIAGNOSIS — N186 End stage renal disease: Secondary | ICD-10-CM | POA: Diagnosis not present

## 2016-04-02 DIAGNOSIS — D631 Anemia in chronic kidney disease: Secondary | ICD-10-CM | POA: Diagnosis not present

## 2016-04-05 DIAGNOSIS — D631 Anemia in chronic kidney disease: Secondary | ICD-10-CM | POA: Diagnosis not present

## 2016-04-05 DIAGNOSIS — N186 End stage renal disease: Secondary | ICD-10-CM | POA: Diagnosis not present

## 2016-04-07 DIAGNOSIS — Z79899 Other long term (current) drug therapy: Secondary | ICD-10-CM | POA: Diagnosis not present

## 2016-04-07 DIAGNOSIS — D631 Anemia in chronic kidney disease: Secondary | ICD-10-CM | POA: Diagnosis not present

## 2016-04-07 DIAGNOSIS — J4540 Moderate persistent asthma, uncomplicated: Secondary | ICD-10-CM | POA: Diagnosis not present

## 2016-04-07 DIAGNOSIS — R634 Abnormal weight loss: Secondary | ICD-10-CM | POA: Diagnosis not present

## 2016-04-07 DIAGNOSIS — Z0001 Encounter for general adult medical examination with abnormal findings: Secondary | ICD-10-CM | POA: Diagnosis not present

## 2016-04-07 DIAGNOSIS — D513 Other dietary vitamin B12 deficiency anemia: Secondary | ICD-10-CM | POA: Diagnosis not present

## 2016-04-07 DIAGNOSIS — N186 End stage renal disease: Secondary | ICD-10-CM | POA: Diagnosis not present

## 2016-04-07 DIAGNOSIS — C880 Waldenstrom macroglobulinemia: Secondary | ICD-10-CM | POA: Diagnosis not present

## 2016-04-07 DIAGNOSIS — I10 Essential (primary) hypertension: Secondary | ICD-10-CM | POA: Diagnosis not present

## 2016-04-09 DIAGNOSIS — D631 Anemia in chronic kidney disease: Secondary | ICD-10-CM | POA: Diagnosis not present

## 2016-04-09 DIAGNOSIS — N186 End stage renal disease: Secondary | ICD-10-CM | POA: Diagnosis not present

## 2016-04-12 DIAGNOSIS — N186 End stage renal disease: Secondary | ICD-10-CM | POA: Diagnosis not present

## 2016-04-14 DIAGNOSIS — N186 End stage renal disease: Secondary | ICD-10-CM | POA: Diagnosis not present

## 2016-04-15 DIAGNOSIS — C880 Waldenstrom macroglobulinemia: Secondary | ICD-10-CM | POA: Diagnosis not present

## 2016-04-16 DIAGNOSIS — N186 End stage renal disease: Secondary | ICD-10-CM | POA: Diagnosis not present

## 2016-04-19 DIAGNOSIS — N186 End stage renal disease: Secondary | ICD-10-CM | POA: Diagnosis not present

## 2016-04-21 DIAGNOSIS — N186 End stage renal disease: Secondary | ICD-10-CM | POA: Diagnosis not present

## 2016-04-23 DIAGNOSIS — N186 End stage renal disease: Secondary | ICD-10-CM | POA: Diagnosis not present

## 2016-04-26 DIAGNOSIS — N186 End stage renal disease: Secondary | ICD-10-CM | POA: Diagnosis not present

## 2016-04-28 DIAGNOSIS — J4540 Moderate persistent asthma, uncomplicated: Secondary | ICD-10-CM | POA: Diagnosis not present

## 2016-04-28 DIAGNOSIS — R634 Abnormal weight loss: Secondary | ICD-10-CM | POA: Diagnosis not present

## 2016-04-28 DIAGNOSIS — R911 Solitary pulmonary nodule: Secondary | ICD-10-CM | POA: Diagnosis not present

## 2016-04-28 DIAGNOSIS — C880 Waldenstrom macroglobulinemia: Secondary | ICD-10-CM | POA: Diagnosis not present

## 2016-04-28 DIAGNOSIS — N186 End stage renal disease: Secondary | ICD-10-CM | POA: Diagnosis not present

## 2016-04-30 DIAGNOSIS — N186 End stage renal disease: Secondary | ICD-10-CM | POA: Diagnosis not present

## 2016-05-03 DIAGNOSIS — N186 End stage renal disease: Secondary | ICD-10-CM | POA: Diagnosis not present

## 2016-05-04 DIAGNOSIS — R911 Solitary pulmonary nodule: Secondary | ICD-10-CM | POA: Diagnosis not present

## 2016-05-04 DIAGNOSIS — C880 Waldenstrom macroglobulinemia: Secondary | ICD-10-CM | POA: Diagnosis not present

## 2016-05-05 DIAGNOSIS — N186 End stage renal disease: Secondary | ICD-10-CM | POA: Diagnosis not present

## 2016-05-06 DIAGNOSIS — E785 Hyperlipidemia, unspecified: Secondary | ICD-10-CM | POA: Diagnosis not present

## 2016-05-06 DIAGNOSIS — N186 End stage renal disease: Secondary | ICD-10-CM | POA: Diagnosis not present

## 2016-05-06 DIAGNOSIS — I120 Hypertensive chronic kidney disease with stage 5 chronic kidney disease or end stage renal disease: Secondary | ICD-10-CM | POA: Diagnosis not present

## 2016-05-06 DIAGNOSIS — Z0000 Encounter for general adult medical examination without abnormal findings: Secondary | ICD-10-CM | POA: Diagnosis not present

## 2016-05-06 DIAGNOSIS — Z992 Dependence on renal dialysis: Secondary | ICD-10-CM | POA: Diagnosis not present

## 2016-05-06 DIAGNOSIS — I2510 Atherosclerotic heart disease of native coronary artery without angina pectoris: Secondary | ICD-10-CM | POA: Diagnosis not present

## 2016-05-07 DIAGNOSIS — N186 End stage renal disease: Secondary | ICD-10-CM | POA: Diagnosis not present

## 2016-05-10 DIAGNOSIS — N186 End stage renal disease: Secondary | ICD-10-CM | POA: Diagnosis not present

## 2016-05-13 DIAGNOSIS — N186 End stage renal disease: Secondary | ICD-10-CM | POA: Diagnosis not present

## 2016-05-14 DIAGNOSIS — N186 End stage renal disease: Secondary | ICD-10-CM | POA: Diagnosis not present

## 2016-05-14 DIAGNOSIS — D631 Anemia in chronic kidney disease: Secondary | ICD-10-CM | POA: Diagnosis not present

## 2016-05-17 DIAGNOSIS — D631 Anemia in chronic kidney disease: Secondary | ICD-10-CM | POA: Diagnosis not present

## 2016-05-17 DIAGNOSIS — N186 End stage renal disease: Secondary | ICD-10-CM | POA: Diagnosis not present

## 2016-05-19 DIAGNOSIS — N186 End stage renal disease: Secondary | ICD-10-CM | POA: Diagnosis not present

## 2016-05-19 DIAGNOSIS — D631 Anemia in chronic kidney disease: Secondary | ICD-10-CM | POA: Diagnosis not present

## 2016-05-21 DIAGNOSIS — D631 Anemia in chronic kidney disease: Secondary | ICD-10-CM | POA: Diagnosis not present

## 2016-05-21 DIAGNOSIS — N186 End stage renal disease: Secondary | ICD-10-CM | POA: Diagnosis not present

## 2016-05-24 DIAGNOSIS — N186 End stage renal disease: Secondary | ICD-10-CM | POA: Diagnosis not present

## 2016-05-24 DIAGNOSIS — D631 Anemia in chronic kidney disease: Secondary | ICD-10-CM | POA: Diagnosis not present

## 2016-05-26 DIAGNOSIS — N186 End stage renal disease: Secondary | ICD-10-CM | POA: Diagnosis not present

## 2016-05-26 DIAGNOSIS — D631 Anemia in chronic kidney disease: Secondary | ICD-10-CM | POA: Diagnosis not present

## 2016-05-26 DIAGNOSIS — R197 Diarrhea, unspecified: Secondary | ICD-10-CM | POA: Diagnosis not present

## 2016-05-26 DIAGNOSIS — J4540 Moderate persistent asthma, uncomplicated: Secondary | ICD-10-CM | POA: Diagnosis not present

## 2016-05-26 DIAGNOSIS — Z23 Encounter for immunization: Secondary | ICD-10-CM | POA: Diagnosis not present

## 2016-05-26 DIAGNOSIS — R911 Solitary pulmonary nodule: Secondary | ICD-10-CM | POA: Diagnosis not present

## 2016-05-26 DIAGNOSIS — C880 Waldenstrom macroglobulinemia: Secondary | ICD-10-CM | POA: Diagnosis not present

## 2016-05-28 DIAGNOSIS — D631 Anemia in chronic kidney disease: Secondary | ICD-10-CM | POA: Diagnosis not present

## 2016-05-28 DIAGNOSIS — N186 End stage renal disease: Secondary | ICD-10-CM | POA: Diagnosis not present

## 2016-05-31 DIAGNOSIS — N186 End stage renal disease: Secondary | ICD-10-CM | POA: Diagnosis not present

## 2016-05-31 DIAGNOSIS — D631 Anemia in chronic kidney disease: Secondary | ICD-10-CM | POA: Diagnosis not present

## 2016-06-02 DIAGNOSIS — D631 Anemia in chronic kidney disease: Secondary | ICD-10-CM | POA: Diagnosis not present

## 2016-06-02 DIAGNOSIS — N186 End stage renal disease: Secondary | ICD-10-CM | POA: Diagnosis not present

## 2016-06-04 DIAGNOSIS — N186 End stage renal disease: Secondary | ICD-10-CM | POA: Diagnosis not present

## 2016-06-04 DIAGNOSIS — D631 Anemia in chronic kidney disease: Secondary | ICD-10-CM | POA: Diagnosis not present

## 2016-06-07 DIAGNOSIS — D631 Anemia in chronic kidney disease: Secondary | ICD-10-CM | POA: Diagnosis not present

## 2016-06-07 DIAGNOSIS — N186 End stage renal disease: Secondary | ICD-10-CM | POA: Diagnosis not present

## 2016-06-09 DIAGNOSIS — N186 End stage renal disease: Secondary | ICD-10-CM | POA: Diagnosis not present

## 2016-06-09 DIAGNOSIS — D631 Anemia in chronic kidney disease: Secondary | ICD-10-CM | POA: Diagnosis not present

## 2016-06-11 DIAGNOSIS — N186 End stage renal disease: Secondary | ICD-10-CM | POA: Diagnosis not present

## 2016-06-11 DIAGNOSIS — D631 Anemia in chronic kidney disease: Secondary | ICD-10-CM | POA: Diagnosis not present

## 2016-06-12 DIAGNOSIS — N186 End stage renal disease: Secondary | ICD-10-CM | POA: Diagnosis not present

## 2016-06-14 DIAGNOSIS — D631 Anemia in chronic kidney disease: Secondary | ICD-10-CM | POA: Diagnosis not present

## 2016-06-14 DIAGNOSIS — N186 End stage renal disease: Secondary | ICD-10-CM | POA: Diagnosis not present

## 2016-06-16 DIAGNOSIS — N186 End stage renal disease: Secondary | ICD-10-CM | POA: Diagnosis not present

## 2016-06-16 DIAGNOSIS — D631 Anemia in chronic kidney disease: Secondary | ICD-10-CM | POA: Diagnosis not present

## 2016-06-18 DIAGNOSIS — D631 Anemia in chronic kidney disease: Secondary | ICD-10-CM | POA: Diagnosis not present

## 2016-06-18 DIAGNOSIS — N186 End stage renal disease: Secondary | ICD-10-CM | POA: Diagnosis not present

## 2016-06-21 DIAGNOSIS — N186 End stage renal disease: Secondary | ICD-10-CM | POA: Diagnosis not present

## 2016-06-21 DIAGNOSIS — D631 Anemia in chronic kidney disease: Secondary | ICD-10-CM | POA: Diagnosis not present

## 2016-06-21 DIAGNOSIS — C880 Waldenstrom macroglobulinemia: Secondary | ICD-10-CM | POA: Diagnosis not present

## 2016-06-24 DIAGNOSIS — D631 Anemia in chronic kidney disease: Secondary | ICD-10-CM | POA: Diagnosis not present

## 2016-06-24 DIAGNOSIS — N186 End stage renal disease: Secondary | ICD-10-CM | POA: Diagnosis not present

## 2016-06-25 DIAGNOSIS — N186 End stage renal disease: Secondary | ICD-10-CM | POA: Diagnosis not present

## 2016-06-25 DIAGNOSIS — D631 Anemia in chronic kidney disease: Secondary | ICD-10-CM | POA: Diagnosis not present

## 2016-06-28 DIAGNOSIS — D631 Anemia in chronic kidney disease: Secondary | ICD-10-CM | POA: Diagnosis not present

## 2016-06-28 DIAGNOSIS — N186 End stage renal disease: Secondary | ICD-10-CM | POA: Diagnosis not present

## 2016-06-30 DIAGNOSIS — N186 End stage renal disease: Secondary | ICD-10-CM | POA: Diagnosis not present

## 2016-06-30 DIAGNOSIS — D631 Anemia in chronic kidney disease: Secondary | ICD-10-CM | POA: Diagnosis not present

## 2016-07-02 DIAGNOSIS — N186 End stage renal disease: Secondary | ICD-10-CM | POA: Diagnosis not present

## 2016-07-02 DIAGNOSIS — D631 Anemia in chronic kidney disease: Secondary | ICD-10-CM | POA: Diagnosis not present

## 2016-07-04 DIAGNOSIS — D631 Anemia in chronic kidney disease: Secondary | ICD-10-CM | POA: Diagnosis not present

## 2016-07-04 DIAGNOSIS — N186 End stage renal disease: Secondary | ICD-10-CM | POA: Diagnosis not present

## 2016-07-05 DIAGNOSIS — D631 Anemia in chronic kidney disease: Secondary | ICD-10-CM | POA: Diagnosis not present

## 2016-07-05 DIAGNOSIS — N186 End stage renal disease: Secondary | ICD-10-CM | POA: Diagnosis not present

## 2016-07-07 DIAGNOSIS — D631 Anemia in chronic kidney disease: Secondary | ICD-10-CM | POA: Diagnosis not present

## 2016-07-07 DIAGNOSIS — N186 End stage renal disease: Secondary | ICD-10-CM | POA: Diagnosis not present

## 2016-07-09 DIAGNOSIS — D631 Anemia in chronic kidney disease: Secondary | ICD-10-CM | POA: Diagnosis not present

## 2016-07-09 DIAGNOSIS — N186 End stage renal disease: Secondary | ICD-10-CM | POA: Diagnosis not present

## 2016-07-12 DIAGNOSIS — N186 End stage renal disease: Secondary | ICD-10-CM | POA: Diagnosis not present

## 2016-07-12 DIAGNOSIS — D631 Anemia in chronic kidney disease: Secondary | ICD-10-CM | POA: Diagnosis not present

## 2016-07-13 DIAGNOSIS — N186 End stage renal disease: Secondary | ICD-10-CM | POA: Diagnosis not present

## 2016-07-14 DIAGNOSIS — D631 Anemia in chronic kidney disease: Secondary | ICD-10-CM | POA: Diagnosis not present

## 2016-07-14 DIAGNOSIS — N186 End stage renal disease: Secondary | ICD-10-CM | POA: Diagnosis not present

## 2016-07-16 DIAGNOSIS — N186 End stage renal disease: Secondary | ICD-10-CM | POA: Diagnosis not present

## 2016-07-16 DIAGNOSIS — D631 Anemia in chronic kidney disease: Secondary | ICD-10-CM | POA: Diagnosis not present

## 2016-07-19 DIAGNOSIS — N186 End stage renal disease: Secondary | ICD-10-CM | POA: Diagnosis not present

## 2016-07-19 DIAGNOSIS — D631 Anemia in chronic kidney disease: Secondary | ICD-10-CM | POA: Diagnosis not present

## 2016-07-21 DIAGNOSIS — N186 End stage renal disease: Secondary | ICD-10-CM | POA: Diagnosis not present

## 2016-07-21 DIAGNOSIS — D631 Anemia in chronic kidney disease: Secondary | ICD-10-CM | POA: Diagnosis not present

## 2016-07-23 DIAGNOSIS — D631 Anemia in chronic kidney disease: Secondary | ICD-10-CM | POA: Diagnosis not present

## 2016-07-23 DIAGNOSIS — N186 End stage renal disease: Secondary | ICD-10-CM | POA: Diagnosis not present

## 2016-07-26 DIAGNOSIS — N186 End stage renal disease: Secondary | ICD-10-CM | POA: Diagnosis not present

## 2016-07-26 DIAGNOSIS — D631 Anemia in chronic kidney disease: Secondary | ICD-10-CM | POA: Diagnosis not present

## 2016-07-28 DIAGNOSIS — D631 Anemia in chronic kidney disease: Secondary | ICD-10-CM | POA: Diagnosis not present

## 2016-07-28 DIAGNOSIS — I953 Hypotension of hemodialysis: Secondary | ICD-10-CM | POA: Diagnosis not present

## 2016-07-28 DIAGNOSIS — N186 End stage renal disease: Secondary | ICD-10-CM | POA: Diagnosis not present

## 2016-07-28 DIAGNOSIS — R197 Diarrhea, unspecified: Secondary | ICD-10-CM | POA: Diagnosis not present

## 2016-07-28 DIAGNOSIS — C880 Waldenstrom macroglobulinemia: Secondary | ICD-10-CM | POA: Diagnosis not present

## 2016-07-28 DIAGNOSIS — J4540 Moderate persistent asthma, uncomplicated: Secondary | ICD-10-CM | POA: Diagnosis not present

## 2016-07-30 DIAGNOSIS — N186 End stage renal disease: Secondary | ICD-10-CM | POA: Diagnosis not present

## 2016-07-30 DIAGNOSIS — D631 Anemia in chronic kidney disease: Secondary | ICD-10-CM | POA: Diagnosis not present

## 2016-08-01 DIAGNOSIS — N186 End stage renal disease: Secondary | ICD-10-CM | POA: Diagnosis not present

## 2016-08-01 DIAGNOSIS — D631 Anemia in chronic kidney disease: Secondary | ICD-10-CM | POA: Diagnosis not present

## 2016-08-03 DIAGNOSIS — N186 End stage renal disease: Secondary | ICD-10-CM | POA: Diagnosis not present

## 2016-08-03 DIAGNOSIS — D631 Anemia in chronic kidney disease: Secondary | ICD-10-CM | POA: Diagnosis not present

## 2016-08-06 DIAGNOSIS — N186 End stage renal disease: Secondary | ICD-10-CM | POA: Diagnosis not present

## 2016-08-06 DIAGNOSIS — D631 Anemia in chronic kidney disease: Secondary | ICD-10-CM | POA: Diagnosis not present

## 2016-08-09 DIAGNOSIS — N186 End stage renal disease: Secondary | ICD-10-CM | POA: Diagnosis not present

## 2016-08-09 DIAGNOSIS — D631 Anemia in chronic kidney disease: Secondary | ICD-10-CM | POA: Diagnosis not present

## 2016-08-11 DIAGNOSIS — N186 End stage renal disease: Secondary | ICD-10-CM | POA: Diagnosis not present

## 2016-08-11 DIAGNOSIS — D631 Anemia in chronic kidney disease: Secondary | ICD-10-CM | POA: Diagnosis not present

## 2016-08-12 DIAGNOSIS — N186 End stage renal disease: Secondary | ICD-10-CM | POA: Diagnosis not present

## 2016-08-13 DIAGNOSIS — N186 End stage renal disease: Secondary | ICD-10-CM | POA: Diagnosis not present

## 2016-08-13 DIAGNOSIS — D631 Anemia in chronic kidney disease: Secondary | ICD-10-CM | POA: Diagnosis not present

## 2016-08-16 DIAGNOSIS — N186 End stage renal disease: Secondary | ICD-10-CM | POA: Diagnosis not present

## 2016-08-16 DIAGNOSIS — D631 Anemia in chronic kidney disease: Secondary | ICD-10-CM | POA: Diagnosis not present

## 2016-08-18 DIAGNOSIS — D631 Anemia in chronic kidney disease: Secondary | ICD-10-CM | POA: Diagnosis not present

## 2016-08-18 DIAGNOSIS — N186 End stage renal disease: Secondary | ICD-10-CM | POA: Diagnosis not present

## 2016-08-20 DIAGNOSIS — D631 Anemia in chronic kidney disease: Secondary | ICD-10-CM | POA: Diagnosis not present

## 2016-08-20 DIAGNOSIS — N186 End stage renal disease: Secondary | ICD-10-CM | POA: Diagnosis not present

## 2016-08-22 DIAGNOSIS — R911 Solitary pulmonary nodule: Secondary | ICD-10-CM | POA: Diagnosis not present

## 2016-08-22 DIAGNOSIS — C880 Waldenstrom macroglobulinemia: Secondary | ICD-10-CM | POA: Diagnosis not present

## 2016-08-23 DIAGNOSIS — N186 End stage renal disease: Secondary | ICD-10-CM | POA: Diagnosis not present

## 2016-08-23 DIAGNOSIS — D631 Anemia in chronic kidney disease: Secondary | ICD-10-CM | POA: Diagnosis not present

## 2016-08-25 DIAGNOSIS — N186 End stage renal disease: Secondary | ICD-10-CM | POA: Diagnosis not present

## 2016-08-25 DIAGNOSIS — D631 Anemia in chronic kidney disease: Secondary | ICD-10-CM | POA: Diagnosis not present

## 2016-08-27 DIAGNOSIS — N186 End stage renal disease: Secondary | ICD-10-CM | POA: Diagnosis not present

## 2016-08-27 DIAGNOSIS — D631 Anemia in chronic kidney disease: Secondary | ICD-10-CM | POA: Diagnosis not present

## 2016-08-30 DIAGNOSIS — N186 End stage renal disease: Secondary | ICD-10-CM | POA: Diagnosis not present

## 2016-08-30 DIAGNOSIS — D631 Anemia in chronic kidney disease: Secondary | ICD-10-CM | POA: Diagnosis not present

## 2016-09-01 DIAGNOSIS — D631 Anemia in chronic kidney disease: Secondary | ICD-10-CM | POA: Diagnosis not present

## 2016-09-01 DIAGNOSIS — N186 End stage renal disease: Secondary | ICD-10-CM | POA: Diagnosis not present

## 2016-09-03 DIAGNOSIS — D631 Anemia in chronic kidney disease: Secondary | ICD-10-CM | POA: Diagnosis not present

## 2016-09-03 DIAGNOSIS — N186 End stage renal disease: Secondary | ICD-10-CM | POA: Diagnosis not present

## 2016-09-06 DIAGNOSIS — D631 Anemia in chronic kidney disease: Secondary | ICD-10-CM | POA: Diagnosis not present

## 2016-09-06 DIAGNOSIS — N186 End stage renal disease: Secondary | ICD-10-CM | POA: Diagnosis not present

## 2016-09-08 DIAGNOSIS — N186 End stage renal disease: Secondary | ICD-10-CM | POA: Diagnosis not present

## 2016-09-08 DIAGNOSIS — D631 Anemia in chronic kidney disease: Secondary | ICD-10-CM | POA: Diagnosis not present

## 2016-09-10 DIAGNOSIS — D631 Anemia in chronic kidney disease: Secondary | ICD-10-CM | POA: Diagnosis not present

## 2016-09-10 DIAGNOSIS — N186 End stage renal disease: Secondary | ICD-10-CM | POA: Diagnosis not present

## 2016-09-12 DIAGNOSIS — N186 End stage renal disease: Secondary | ICD-10-CM | POA: Diagnosis not present

## 2016-09-13 DIAGNOSIS — R740 Nonspecific elevation of levels of transaminase and lactic acid dehydrogenase [LDH]: Secondary | ICD-10-CM | POA: Diagnosis not present

## 2016-09-13 DIAGNOSIS — N186 End stage renal disease: Secondary | ICD-10-CM | POA: Diagnosis not present

## 2016-09-13 DIAGNOSIS — D631 Anemia in chronic kidney disease: Secondary | ICD-10-CM | POA: Diagnosis not present

## 2016-09-17 DIAGNOSIS — R740 Nonspecific elevation of levels of transaminase and lactic acid dehydrogenase [LDH]: Secondary | ICD-10-CM | POA: Diagnosis not present

## 2016-09-17 DIAGNOSIS — D631 Anemia in chronic kidney disease: Secondary | ICD-10-CM | POA: Diagnosis not present

## 2016-09-17 DIAGNOSIS — N186 End stage renal disease: Secondary | ICD-10-CM | POA: Diagnosis not present

## 2016-09-20 DIAGNOSIS — R740 Nonspecific elevation of levels of transaminase and lactic acid dehydrogenase [LDH]: Secondary | ICD-10-CM | POA: Diagnosis not present

## 2016-09-20 DIAGNOSIS — D631 Anemia in chronic kidney disease: Secondary | ICD-10-CM | POA: Diagnosis not present

## 2016-09-20 DIAGNOSIS — N186 End stage renal disease: Secondary | ICD-10-CM | POA: Diagnosis not present

## 2016-09-22 DIAGNOSIS — J4540 Moderate persistent asthma, uncomplicated: Secondary | ICD-10-CM | POA: Diagnosis not present

## 2016-09-22 DIAGNOSIS — C880 Waldenstrom macroglobulinemia: Secondary | ICD-10-CM | POA: Diagnosis not present

## 2016-09-22 DIAGNOSIS — R740 Nonspecific elevation of levels of transaminase and lactic acid dehydrogenase [LDH]: Secondary | ICD-10-CM | POA: Diagnosis not present

## 2016-09-22 DIAGNOSIS — N186 End stage renal disease: Secondary | ICD-10-CM | POA: Diagnosis not present

## 2016-09-22 DIAGNOSIS — R197 Diarrhea, unspecified: Secondary | ICD-10-CM | POA: Diagnosis not present

## 2016-09-22 DIAGNOSIS — R911 Solitary pulmonary nodule: Secondary | ICD-10-CM | POA: Diagnosis not present

## 2016-09-22 DIAGNOSIS — D631 Anemia in chronic kidney disease: Secondary | ICD-10-CM | POA: Diagnosis not present

## 2016-09-24 DIAGNOSIS — N186 End stage renal disease: Secondary | ICD-10-CM | POA: Diagnosis not present

## 2016-09-24 DIAGNOSIS — R740 Nonspecific elevation of levels of transaminase and lactic acid dehydrogenase [LDH]: Secondary | ICD-10-CM | POA: Diagnosis not present

## 2016-09-24 DIAGNOSIS — D631 Anemia in chronic kidney disease: Secondary | ICD-10-CM | POA: Diagnosis not present

## 2016-09-27 DIAGNOSIS — R740 Nonspecific elevation of levels of transaminase and lactic acid dehydrogenase [LDH]: Secondary | ICD-10-CM | POA: Diagnosis not present

## 2016-09-27 DIAGNOSIS — D631 Anemia in chronic kidney disease: Secondary | ICD-10-CM | POA: Diagnosis not present

## 2016-09-27 DIAGNOSIS — N186 End stage renal disease: Secondary | ICD-10-CM | POA: Diagnosis not present

## 2016-09-29 DIAGNOSIS — D631 Anemia in chronic kidney disease: Secondary | ICD-10-CM | POA: Diagnosis not present

## 2016-09-29 DIAGNOSIS — N186 End stage renal disease: Secondary | ICD-10-CM | POA: Diagnosis not present

## 2016-09-29 DIAGNOSIS — R740 Nonspecific elevation of levels of transaminase and lactic acid dehydrogenase [LDH]: Secondary | ICD-10-CM | POA: Diagnosis not present

## 2016-10-01 DIAGNOSIS — R740 Nonspecific elevation of levels of transaminase and lactic acid dehydrogenase [LDH]: Secondary | ICD-10-CM | POA: Diagnosis not present

## 2016-10-01 DIAGNOSIS — N186 End stage renal disease: Secondary | ICD-10-CM | POA: Diagnosis not present

## 2016-10-01 DIAGNOSIS — D631 Anemia in chronic kidney disease: Secondary | ICD-10-CM | POA: Diagnosis not present

## 2016-10-03 DIAGNOSIS — C880 Waldenstrom macroglobulinemia: Secondary | ICD-10-CM | POA: Diagnosis not present

## 2016-10-03 DIAGNOSIS — K521 Toxic gastroenteritis and colitis: Secondary | ICD-10-CM | POA: Diagnosis not present

## 2016-10-03 DIAGNOSIS — R197 Diarrhea, unspecified: Secondary | ICD-10-CM | POA: Diagnosis not present

## 2016-10-04 DIAGNOSIS — D631 Anemia in chronic kidney disease: Secondary | ICD-10-CM | POA: Diagnosis not present

## 2016-10-04 DIAGNOSIS — N186 End stage renal disease: Secondary | ICD-10-CM | POA: Diagnosis not present

## 2016-10-04 DIAGNOSIS — R740 Nonspecific elevation of levels of transaminase and lactic acid dehydrogenase [LDH]: Secondary | ICD-10-CM | POA: Diagnosis not present

## 2016-10-06 DIAGNOSIS — N186 End stage renal disease: Secondary | ICD-10-CM | POA: Diagnosis not present

## 2016-10-06 DIAGNOSIS — R740 Nonspecific elevation of levels of transaminase and lactic acid dehydrogenase [LDH]: Secondary | ICD-10-CM | POA: Diagnosis not present

## 2016-10-06 DIAGNOSIS — D631 Anemia in chronic kidney disease: Secondary | ICD-10-CM | POA: Diagnosis not present

## 2016-10-08 DIAGNOSIS — D631 Anemia in chronic kidney disease: Secondary | ICD-10-CM | POA: Diagnosis not present

## 2016-10-08 DIAGNOSIS — R740 Nonspecific elevation of levels of transaminase and lactic acid dehydrogenase [LDH]: Secondary | ICD-10-CM | POA: Diagnosis not present

## 2016-10-08 DIAGNOSIS — N186 End stage renal disease: Secondary | ICD-10-CM | POA: Diagnosis not present

## 2016-10-11 DIAGNOSIS — R740 Nonspecific elevation of levels of transaminase and lactic acid dehydrogenase [LDH]: Secondary | ICD-10-CM | POA: Diagnosis not present

## 2016-10-11 DIAGNOSIS — N186 End stage renal disease: Secondary | ICD-10-CM | POA: Diagnosis not present

## 2016-10-11 DIAGNOSIS — D631 Anemia in chronic kidney disease: Secondary | ICD-10-CM | POA: Diagnosis not present

## 2016-10-12 DIAGNOSIS — N186 End stage renal disease: Secondary | ICD-10-CM | POA: Diagnosis not present

## 2016-10-13 DIAGNOSIS — Z23 Encounter for immunization: Secondary | ICD-10-CM | POA: Diagnosis not present

## 2016-10-13 DIAGNOSIS — D631 Anemia in chronic kidney disease: Secondary | ICD-10-CM | POA: Diagnosis not present

## 2016-10-13 DIAGNOSIS — N186 End stage renal disease: Secondary | ICD-10-CM | POA: Diagnosis not present

## 2016-10-13 DIAGNOSIS — R740 Nonspecific elevation of levels of transaminase and lactic acid dehydrogenase [LDH]: Secondary | ICD-10-CM | POA: Diagnosis not present

## 2016-10-18 DIAGNOSIS — D631 Anemia in chronic kidney disease: Secondary | ICD-10-CM | POA: Diagnosis not present

## 2016-10-18 DIAGNOSIS — N186 End stage renal disease: Secondary | ICD-10-CM | POA: Diagnosis not present

## 2016-10-18 DIAGNOSIS — Z23 Encounter for immunization: Secondary | ICD-10-CM | POA: Diagnosis not present

## 2016-10-18 DIAGNOSIS — R740 Nonspecific elevation of levels of transaminase and lactic acid dehydrogenase [LDH]: Secondary | ICD-10-CM | POA: Diagnosis not present

## 2016-10-20 DIAGNOSIS — R740 Nonspecific elevation of levels of transaminase and lactic acid dehydrogenase [LDH]: Secondary | ICD-10-CM | POA: Diagnosis not present

## 2016-10-20 DIAGNOSIS — D631 Anemia in chronic kidney disease: Secondary | ICD-10-CM | POA: Diagnosis not present

## 2016-10-20 DIAGNOSIS — N186 End stage renal disease: Secondary | ICD-10-CM | POA: Diagnosis not present

## 2016-10-20 DIAGNOSIS — Z23 Encounter for immunization: Secondary | ICD-10-CM | POA: Diagnosis not present

## 2016-10-22 DIAGNOSIS — Z23 Encounter for immunization: Secondary | ICD-10-CM | POA: Diagnosis not present

## 2016-10-22 DIAGNOSIS — D631 Anemia in chronic kidney disease: Secondary | ICD-10-CM | POA: Diagnosis not present

## 2016-10-22 DIAGNOSIS — R740 Nonspecific elevation of levels of transaminase and lactic acid dehydrogenase [LDH]: Secondary | ICD-10-CM | POA: Diagnosis not present

## 2016-10-22 DIAGNOSIS — N186 End stage renal disease: Secondary | ICD-10-CM | POA: Diagnosis not present

## 2016-10-25 DIAGNOSIS — N186 End stage renal disease: Secondary | ICD-10-CM | POA: Diagnosis not present

## 2016-10-25 DIAGNOSIS — D631 Anemia in chronic kidney disease: Secondary | ICD-10-CM | POA: Diagnosis not present

## 2016-10-25 DIAGNOSIS — R740 Nonspecific elevation of levels of transaminase and lactic acid dehydrogenase [LDH]: Secondary | ICD-10-CM | POA: Diagnosis not present

## 2016-10-25 DIAGNOSIS — Z23 Encounter for immunization: Secondary | ICD-10-CM | POA: Diagnosis not present

## 2016-10-27 DIAGNOSIS — D631 Anemia in chronic kidney disease: Secondary | ICD-10-CM | POA: Diagnosis not present

## 2016-10-27 DIAGNOSIS — N186 End stage renal disease: Secondary | ICD-10-CM | POA: Diagnosis not present

## 2016-10-27 DIAGNOSIS — Z23 Encounter for immunization: Secondary | ICD-10-CM | POA: Diagnosis not present

## 2016-10-27 DIAGNOSIS — R740 Nonspecific elevation of levels of transaminase and lactic acid dehydrogenase [LDH]: Secondary | ICD-10-CM | POA: Diagnosis not present

## 2016-10-29 DIAGNOSIS — D631 Anemia in chronic kidney disease: Secondary | ICD-10-CM | POA: Diagnosis not present

## 2016-10-29 DIAGNOSIS — Z23 Encounter for immunization: Secondary | ICD-10-CM | POA: Diagnosis not present

## 2016-10-29 DIAGNOSIS — N186 End stage renal disease: Secondary | ICD-10-CM | POA: Diagnosis not present

## 2016-10-29 DIAGNOSIS — R740 Nonspecific elevation of levels of transaminase and lactic acid dehydrogenase [LDH]: Secondary | ICD-10-CM | POA: Diagnosis not present

## 2016-11-01 DIAGNOSIS — N186 End stage renal disease: Secondary | ICD-10-CM | POA: Diagnosis not present

## 2016-11-01 DIAGNOSIS — D631 Anemia in chronic kidney disease: Secondary | ICD-10-CM | POA: Diagnosis not present

## 2016-11-01 DIAGNOSIS — R740 Nonspecific elevation of levels of transaminase and lactic acid dehydrogenase [LDH]: Secondary | ICD-10-CM | POA: Diagnosis not present

## 2016-11-01 DIAGNOSIS — Z23 Encounter for immunization: Secondary | ICD-10-CM | POA: Diagnosis not present

## 2016-11-03 DIAGNOSIS — N186 End stage renal disease: Secondary | ICD-10-CM | POA: Diagnosis not present

## 2016-11-03 DIAGNOSIS — R740 Nonspecific elevation of levels of transaminase and lactic acid dehydrogenase [LDH]: Secondary | ICD-10-CM | POA: Diagnosis not present

## 2016-11-03 DIAGNOSIS — Z23 Encounter for immunization: Secondary | ICD-10-CM | POA: Diagnosis not present

## 2016-11-03 DIAGNOSIS — D631 Anemia in chronic kidney disease: Secondary | ICD-10-CM | POA: Diagnosis not present

## 2016-11-05 DIAGNOSIS — N186 End stage renal disease: Secondary | ICD-10-CM | POA: Diagnosis not present

## 2016-11-05 DIAGNOSIS — R740 Nonspecific elevation of levels of transaminase and lactic acid dehydrogenase [LDH]: Secondary | ICD-10-CM | POA: Diagnosis not present

## 2016-11-05 DIAGNOSIS — Z23 Encounter for immunization: Secondary | ICD-10-CM | POA: Diagnosis not present

## 2016-11-05 DIAGNOSIS — D631 Anemia in chronic kidney disease: Secondary | ICD-10-CM | POA: Diagnosis not present

## 2016-11-08 DIAGNOSIS — Z23 Encounter for immunization: Secondary | ICD-10-CM | POA: Diagnosis not present

## 2016-11-08 DIAGNOSIS — D631 Anemia in chronic kidney disease: Secondary | ICD-10-CM | POA: Diagnosis not present

## 2016-11-08 DIAGNOSIS — R740 Nonspecific elevation of levels of transaminase and lactic acid dehydrogenase [LDH]: Secondary | ICD-10-CM | POA: Diagnosis not present

## 2016-11-08 DIAGNOSIS — N186 End stage renal disease: Secondary | ICD-10-CM | POA: Diagnosis not present

## 2016-11-10 DIAGNOSIS — D631 Anemia in chronic kidney disease: Secondary | ICD-10-CM | POA: Diagnosis not present

## 2016-11-10 DIAGNOSIS — N186 End stage renal disease: Secondary | ICD-10-CM | POA: Diagnosis not present

## 2016-11-12 DIAGNOSIS — N186 End stage renal disease: Secondary | ICD-10-CM | POA: Diagnosis not present

## 2016-11-12 DIAGNOSIS — M19041 Primary osteoarthritis, right hand: Secondary | ICD-10-CM | POA: Diagnosis not present

## 2016-11-12 DIAGNOSIS — C8590 Non-Hodgkin lymphoma, unspecified, unspecified site: Secondary | ICD-10-CM | POA: Diagnosis not present

## 2016-11-12 DIAGNOSIS — R531 Weakness: Secondary | ICD-10-CM | POA: Diagnosis not present

## 2016-11-12 DIAGNOSIS — S60311A Abrasion of right thumb, initial encounter: Secondary | ICD-10-CM | POA: Diagnosis not present

## 2016-11-12 DIAGNOSIS — N19 Unspecified kidney failure: Secondary | ICD-10-CM | POA: Diagnosis not present

## 2016-11-12 DIAGNOSIS — S6991XA Unspecified injury of right wrist, hand and finger(s), initial encounter: Secondary | ICD-10-CM | POA: Diagnosis not present

## 2016-11-12 DIAGNOSIS — D631 Anemia in chronic kidney disease: Secondary | ICD-10-CM | POA: Diagnosis not present

## 2016-11-12 DIAGNOSIS — S0990XA Unspecified injury of head, initial encounter: Secondary | ICD-10-CM | POA: Diagnosis not present

## 2016-11-15 DIAGNOSIS — D631 Anemia in chronic kidney disease: Secondary | ICD-10-CM | POA: Diagnosis not present

## 2016-11-15 DIAGNOSIS — N186 End stage renal disease: Secondary | ICD-10-CM | POA: Diagnosis not present

## 2016-11-16 DIAGNOSIS — S0993XA Unspecified injury of face, initial encounter: Secondary | ICD-10-CM | POA: Diagnosis not present

## 2016-11-16 DIAGNOSIS — I120 Hypertensive chronic kidney disease with stage 5 chronic kidney disease or end stage renal disease: Secondary | ICD-10-CM | POA: Diagnosis not present

## 2016-11-16 DIAGNOSIS — E859 Amyloidosis, unspecified: Secondary | ICD-10-CM | POA: Diagnosis not present

## 2016-11-16 DIAGNOSIS — G238 Other specified degenerative diseases of basal ganglia: Secondary | ICD-10-CM | POA: Diagnosis not present

## 2016-11-16 DIAGNOSIS — S0990XA Unspecified injury of head, initial encounter: Secondary | ICD-10-CM | POA: Diagnosis not present

## 2016-11-16 DIAGNOSIS — R9089 Other abnormal findings on diagnostic imaging of central nervous system: Secondary | ICD-10-CM | POA: Diagnosis not present

## 2016-11-16 DIAGNOSIS — R55 Syncope and collapse: Secondary | ICD-10-CM | POA: Diagnosis not present

## 2016-11-16 DIAGNOSIS — N186 End stage renal disease: Secondary | ICD-10-CM | POA: Diagnosis not present

## 2016-11-16 DIAGNOSIS — R0602 Shortness of breath: Secondary | ICD-10-CM | POA: Diagnosis not present

## 2016-11-16 DIAGNOSIS — G459 Transient cerebral ischemic attack, unspecified: Secondary | ICD-10-CM | POA: Diagnosis not present

## 2016-11-16 DIAGNOSIS — R531 Weakness: Secondary | ICD-10-CM | POA: Diagnosis not present

## 2016-11-16 DIAGNOSIS — I517 Cardiomegaly: Secondary | ICD-10-CM | POA: Diagnosis not present

## 2016-11-16 DIAGNOSIS — C8590 Non-Hodgkin lymphoma, unspecified, unspecified site: Secondary | ICD-10-CM | POA: Diagnosis not present

## 2016-11-16 DIAGNOSIS — R220 Localized swelling, mass and lump, head: Secondary | ICD-10-CM | POA: Diagnosis not present

## 2016-11-16 DIAGNOSIS — M6281 Muscle weakness (generalized): Secondary | ICD-10-CM | POA: Diagnosis not present

## 2016-11-16 DIAGNOSIS — E43 Unspecified severe protein-calorie malnutrition: Secondary | ICD-10-CM | POA: Diagnosis not present

## 2016-11-16 DIAGNOSIS — E860 Dehydration: Secondary | ICD-10-CM | POA: Diagnosis not present

## 2016-11-17 DIAGNOSIS — N261 Atrophy of kidney (terminal): Secondary | ICD-10-CM | POA: Diagnosis not present

## 2016-11-17 DIAGNOSIS — R197 Diarrhea, unspecified: Secondary | ICD-10-CM | POA: Diagnosis not present

## 2016-11-17 DIAGNOSIS — R162 Hepatomegaly with splenomegaly, not elsewhere classified: Secondary | ICD-10-CM | POA: Diagnosis not present

## 2016-11-17 DIAGNOSIS — D128 Benign neoplasm of rectum: Secondary | ICD-10-CM | POA: Diagnosis not present

## 2016-11-17 DIAGNOSIS — K2970 Gastritis, unspecified, without bleeding: Secondary | ICD-10-CM | POA: Diagnosis present

## 2016-11-17 DIAGNOSIS — R531 Weakness: Secondary | ICD-10-CM | POA: Diagnosis not present

## 2016-11-17 DIAGNOSIS — S0093XA Contusion of unspecified part of head, initial encounter: Secondary | ICD-10-CM | POA: Diagnosis not present

## 2016-11-17 DIAGNOSIS — L89152 Pressure ulcer of sacral region, stage 2: Secondary | ICD-10-CM | POA: Diagnosis present

## 2016-11-17 DIAGNOSIS — D127 Benign neoplasm of rectosigmoid junction: Secondary | ICD-10-CM | POA: Diagnosis present

## 2016-11-17 DIAGNOSIS — K2980 Duodenitis without bleeding: Secondary | ICD-10-CM | POA: Diagnosis not present

## 2016-11-17 DIAGNOSIS — J90 Pleural effusion, not elsewhere classified: Secondary | ICD-10-CM | POA: Diagnosis not present

## 2016-11-17 DIAGNOSIS — R55 Syncope and collapse: Secondary | ICD-10-CM | POA: Diagnosis not present

## 2016-11-17 DIAGNOSIS — Z7189 Other specified counseling: Secondary | ICD-10-CM | POA: Diagnosis not present

## 2016-11-17 DIAGNOSIS — R634 Abnormal weight loss: Secondary | ICD-10-CM | POA: Diagnosis not present

## 2016-11-17 DIAGNOSIS — E440 Moderate protein-calorie malnutrition: Secondary | ICD-10-CM | POA: Diagnosis not present

## 2016-11-17 DIAGNOSIS — R930 Abnormal findings on diagnostic imaging of skull and head, not elsewhere classified: Secondary | ICD-10-CM | POA: Diagnosis not present

## 2016-11-17 DIAGNOSIS — R627 Adult failure to thrive: Secondary | ICD-10-CM | POA: Diagnosis present

## 2016-11-17 DIAGNOSIS — C8590 Non-Hodgkin lymphoma, unspecified, unspecified site: Secondary | ICD-10-CM | POA: Diagnosis present

## 2016-11-17 DIAGNOSIS — E859 Amyloidosis, unspecified: Secondary | ICD-10-CM | POA: Diagnosis not present

## 2016-11-17 DIAGNOSIS — R9089 Other abnormal findings on diagnostic imaging of central nervous system: Secondary | ICD-10-CM | POA: Diagnosis not present

## 2016-11-17 DIAGNOSIS — R9082 White matter disease, unspecified: Secondary | ICD-10-CM | POA: Diagnosis not present

## 2016-11-17 DIAGNOSIS — E860 Dehydration: Secondary | ICD-10-CM | POA: Diagnosis present

## 2016-11-17 DIAGNOSIS — K219 Gastro-esophageal reflux disease without esophagitis: Secondary | ICD-10-CM | POA: Diagnosis present

## 2016-11-17 DIAGNOSIS — D123 Benign neoplasm of transverse colon: Secondary | ICD-10-CM | POA: Diagnosis not present

## 2016-11-17 DIAGNOSIS — C9000 Multiple myeloma not having achieved remission: Secondary | ICD-10-CM | POA: Diagnosis present

## 2016-11-17 DIAGNOSIS — E162 Hypoglycemia, unspecified: Secondary | ICD-10-CM | POA: Diagnosis not present

## 2016-11-17 DIAGNOSIS — D631 Anemia in chronic kidney disease: Secondary | ICD-10-CM | POA: Diagnosis not present

## 2016-11-17 DIAGNOSIS — I517 Cardiomegaly: Secondary | ICD-10-CM | POA: Diagnosis not present

## 2016-11-17 DIAGNOSIS — K640 First degree hemorrhoids: Secondary | ICD-10-CM | POA: Diagnosis not present

## 2016-11-17 DIAGNOSIS — R918 Other nonspecific abnormal finding of lung field: Secondary | ICD-10-CM | POA: Diagnosis not present

## 2016-11-17 DIAGNOSIS — E213 Hyperparathyroidism, unspecified: Secondary | ICD-10-CM | POA: Diagnosis present

## 2016-11-17 DIAGNOSIS — R260 Ataxic gait: Secondary | ICD-10-CM | POA: Diagnosis not present

## 2016-11-17 DIAGNOSIS — G458 Other transient cerebral ischemic attacks and related syndromes: Secondary | ICD-10-CM | POA: Diagnosis not present

## 2016-11-17 DIAGNOSIS — R64 Cachexia: Secondary | ICD-10-CM | POA: Diagnosis not present

## 2016-11-17 DIAGNOSIS — E43 Unspecified severe protein-calorie malnutrition: Secondary | ICD-10-CM | POA: Diagnosis not present

## 2016-11-17 DIAGNOSIS — I2510 Atherosclerotic heart disease of native coronary artery without angina pectoris: Secondary | ICD-10-CM | POA: Diagnosis not present

## 2016-11-17 DIAGNOSIS — K8020 Calculus of gallbladder without cholecystitis without obstruction: Secondary | ICD-10-CM | POA: Diagnosis not present

## 2016-11-17 DIAGNOSIS — K635 Polyp of colon: Secondary | ICD-10-CM | POA: Diagnosis not present

## 2016-11-17 DIAGNOSIS — E871 Hypo-osmolality and hyponatremia: Secondary | ICD-10-CM | POA: Diagnosis not present

## 2016-11-17 DIAGNOSIS — I639 Cerebral infarction, unspecified: Secondary | ICD-10-CM | POA: Diagnosis not present

## 2016-11-17 DIAGNOSIS — N185 Chronic kidney disease, stage 5: Secondary | ICD-10-CM | POA: Diagnosis not present

## 2016-11-17 DIAGNOSIS — R630 Anorexia: Secondary | ICD-10-CM | POA: Diagnosis not present

## 2016-11-17 DIAGNOSIS — R601 Generalized edema: Secondary | ICD-10-CM | POA: Diagnosis not present

## 2016-11-17 DIAGNOSIS — N186 End stage renal disease: Secondary | ICD-10-CM | POA: Diagnosis not present

## 2016-11-17 DIAGNOSIS — J45909 Unspecified asthma, uncomplicated: Secondary | ICD-10-CM | POA: Diagnosis present

## 2016-11-17 DIAGNOSIS — I6523 Occlusion and stenosis of bilateral carotid arteries: Secondary | ICD-10-CM | POA: Diagnosis not present

## 2016-11-17 DIAGNOSIS — M6281 Muscle weakness (generalized): Secondary | ICD-10-CM | POA: Diagnosis not present

## 2016-11-17 DIAGNOSIS — C880 Waldenstrom macroglobulinemia: Secondary | ICD-10-CM | POA: Diagnosis not present

## 2016-11-17 DIAGNOSIS — G459 Transient cerebral ischemic attack, unspecified: Secondary | ICD-10-CM | POA: Diagnosis present

## 2016-11-17 DIAGNOSIS — F0390 Unspecified dementia without behavioral disturbance: Secondary | ICD-10-CM | POA: Diagnosis present

## 2016-11-17 DIAGNOSIS — Z992 Dependence on renal dialysis: Secondary | ICD-10-CM | POA: Diagnosis not present

## 2016-11-17 DIAGNOSIS — I469 Cardiac arrest, cause unspecified: Secondary | ICD-10-CM | POA: Diagnosis not present

## 2016-11-17 DIAGNOSIS — R109 Unspecified abdominal pain: Secondary | ICD-10-CM | POA: Diagnosis not present

## 2016-11-17 DIAGNOSIS — B9681 Helicobacter pylori [H. pylori] as the cause of diseases classified elsewhere: Secondary | ICD-10-CM | POA: Diagnosis not present

## 2016-11-17 DIAGNOSIS — I120 Hypertensive chronic kidney disease with stage 5 chronic kidney disease or end stage renal disease: Secondary | ICD-10-CM | POA: Diagnosis present

## 2016-11-17 DIAGNOSIS — N19 Unspecified kidney failure: Secondary | ICD-10-CM | POA: Diagnosis not present

## 2016-11-17 DIAGNOSIS — K2210 Ulcer of esophagus without bleeding: Secondary | ICD-10-CM | POA: Diagnosis not present

## 2016-11-17 DIAGNOSIS — K2950 Unspecified chronic gastritis without bleeding: Secondary | ICD-10-CM | POA: Diagnosis not present

## 2016-11-17 DIAGNOSIS — R0989 Other specified symptoms and signs involving the circulatory and respiratory systems: Secondary | ICD-10-CM | POA: Diagnosis not present

## 2016-11-17 DIAGNOSIS — E46 Unspecified protein-calorie malnutrition: Secondary | ICD-10-CM | POA: Diagnosis not present

## 2016-11-17 DIAGNOSIS — E875 Hyperkalemia: Secondary | ICD-10-CM | POA: Diagnosis not present

## 2016-11-17 DIAGNOSIS — R0602 Shortness of breath: Secondary | ICD-10-CM | POA: Diagnosis not present

## 2016-12-11 DEATH — deceased
